# Patient Record
Sex: Female | Born: 1995 | Hispanic: No | Marital: Single | Smoking: Former smoker
Health system: Southern US, Community
[De-identification: ages and names within clinical notes are randomized; demographics above are authoritative.]

## PROBLEM LIST (undated history)

## (undated) DIAGNOSIS — J302 Other seasonal allergic rhinitis: Secondary | ICD-10-CM

## (undated) HISTORY — PX: NO PAST SURGERIES: SHX2092

---
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
## 2017-03-09 ENCOUNTER — Ambulatory Visit: Payer: Self-pay | Admitting: Advanced Practice Midwife

## 2017-03-09 ENCOUNTER — Encounter: Payer: Self-pay | Admitting: Advanced Practice Midwife

## 2017-03-15 ENCOUNTER — Encounter: Payer: Self-pay | Admitting: Advanced Practice Midwife

## 2017-03-15 ENCOUNTER — Ambulatory Visit (INDEPENDENT_AMBULATORY_CARE_PROVIDER_SITE_OTHER): Payer: Medicaid Other | Admitting: Advanced Practice Midwife

## 2017-03-15 VITALS — BP 130/70 | HR 72 | Ht 67.0 in | Wt 197.0 lb

## 2017-03-15 DIAGNOSIS — Z0000 Encounter for general adult medical examination without abnormal findings: Secondary | ICD-10-CM

## 2017-03-15 DIAGNOSIS — Z30017 Encounter for initial prescription of implantable subdermal contraceptive: Secondary | ICD-10-CM | POA: Diagnosis not present

## 2017-03-15 DIAGNOSIS — Z3046 Encounter for surveillance of implantable subdermal contraceptive: Secondary | ICD-10-CM | POA: Diagnosis not present

## 2017-03-15 NOTE — Progress Notes (Signed)
Patient ID: Georga KaufmannSamantha S Luepke, female   DOB: 1995-11-24, 22 y.o.   MRN: [id number]     Gynecology Annual Exam  [facility]  Chief Complaint:  Chief Complaint  Patient presents with  . Gynecologic Exam    talk about bc - use to be on the nexplanon;     History of Present Illness: Patient is a 22 y.o. G0P0000 presents for annual exam. The patient has no complaints today. She requests a new Nexplanon today. She previously had headaches on Nexplanon and had it removed prior to 3 years duration. She understands that she could experience headaches again as a side effect of Nexplanon and wants to proceed with getting the device.   LMP: Patient's last menstrual period was 03/09/2017 (exact date). Menarche:not applicable Average Interval: regular, 28 days Duration of flow: 7 days Heavy Menses: some days Clots: no Intermenstrual Bleeding: no Postcoital Bleeding: no Dysmenorrhea: no  The patient is sexually active. She currently uses condoms for contraception. She denies dyspareunia.  The patient does perform self breast exams.  There is no notable family history of breast or ovarian cancer in her family.  The patient wears seatbelts: yes.  The patient has regular exercise: she walks at her job.  She admits to trying to eat a healthy diet but needs to increase intake of h2o. She admits to adequate sleep and good support system.  The patient denies current symptoms of depression.    Review of Systems: Review of Systems  Constitutional: Negative.   HENT: Negative.   Eyes: Negative.   Respiratory: Negative.   Cardiovascular: Negative.   Gastrointestinal: Negative.   Genitourinary: Negative.   Musculoskeletal: Negative.   Skin: Negative.   Neurological: Negative.   Endo/Heme/Allergies: Negative.   Psychiatric/Behavioral: Negative.     Past Medical History:  History reviewed. No pertinent past medical history.  Past Surgical History:  Past Surgical History:    Procedure Laterality Date  . NO PAST SURGERIES      Gynecologic History:  Patient's last menstrual period was 03/09/2017 (exact date). Contraception: condoms Last Pap: She has never had a PAP smear due to age  Obstetric History: G0P0000  Family History:  Family History  Problem Relation Age of Onset  . Hypertension Father   . Diabetes Mellitus II Paternal Grandmother   . Breast cancer Maternal Aunt     Social History:  Social History   Socioeconomic History  . Marital status: Single    Spouse name: Not on file  . Number of children: Not on file  . Years of education: Not on file  . Highest education level: Not on file  Social Needs  . Financial resource strain: Not on file  . Food insecurity - worry: Not on file  . Food insecurity - inability: Not on file  . Transportation needs - medical: Not on file  . Transportation needs - non-medical: Not on file  Occupational History  . Not on file  Tobacco Use  . Smoking status: Former Games developermoker  . Smokeless tobacco: Never Used  Substance and Sexual Activity  . Alcohol use: No  . Drug use: No  . Sexual activity: Yes    Birth control/protection: Condom  Other Topics Concern  . Not on file  Social History Narrative  . Not on file    Allergies:  Allergies  Allergen Reactions  . Pollen Extract Other (See Comments)    Itchy, water eyes, stuffy nose, congestion    Medications: Prior to  Admission medications   Medication Sig Start Date End Date Taking? Authorizing Provider  ibuprofen (ADVIL,MOTRIN) 200 MG tablet Take 800 mg by mouth as needed for cramping.   Yes [provider]    Physical Exam Vitals: Blood pressure 130/70, pulse 72, height 5\' 7"  (1.702 m), weight 197 lb (89.4 kg), last menstrual period 03/09/2017.  General: NAD HEENT: normocephalic, anicteric Thyroid: no enlargement, no palpable nodules Pulmonary: No increased work of breathing, CTAB Cardiovascular: RRR, distal pulses 2+ Breast: Breast  symmetrical, no tenderness, no palpable nodules or masses, no skin or nipple retraction present, no nipple discharge.  No axillary or supraclavicular lymphadenopathy. Abdomen: NABS, soft, non-tender, non-distended.  Umbilicus without lesions.  No hepatomegaly, splenomegaly or masses palpable. No evidence of hernia  Genitourinary:  Deferred for no PAP smear and no concerns. Discussed reasons for doing pelvic exam with patient and patient declines exam Extremities: no edema, erythema, or tenderness Neurologic: Grossly intact Psychiatric: mood appropriate, affect full   Assessment: 22 y.o. G0P0000 routine annual exam  Plan: Problem List Items Addressed This Visit    None    Visit Diagnoses    Well woman exam without gynecological exam    -  Primary   Nexplanon insertion          1) 4) Gardasil Series discussed and if applicable offered to patient - Patient thinks she has previously completed 3 shot series   2) STI screening was offered and declined   3) ASCCP guidelines and rational discussed.  Patient opts for beginning at age 69 screening interval  4) Contraception - Nexplanon   5) Follow up 1 year for routine annual exam  Tresea Mall, CNM       GYNECOLOGY PROCEDURE NOTE  Patient is a 22 y.o. G0P0000 presenting for Nexplanon insertion as her desired means of contraception.  She provided informed consent, signed copy in the chart, time out was performed. Self reported LMP of Patient's last menstrual period was 03/09/2017 (exact date).  She understands that Nexplanon is a progesterone only therapy, and that often patients have irregular and unpredictable vaginal bleeding or amenorrhea. She understands that other side effects are possible related to systemic progesterone, including but not limited to, headaches, breast tenderness, nausea, and irritability. While effective at preventing pregnancy long acting reversible contraceptives do not prevent transmission of sexually  transmitted diseases and use of barrier methods for this purpose was discussed. The placement procedure for Nexplanon was reviewed with the patient in detail including risks of nerve injury, infection, bleeding and injury to other muscles or tendons. She understands that the Nexplanon implant is good for 3 years and needs to be removed at the end of that time.  She understands that Nexplanon is an extremely effective option for contraception, with failure rate of <1%. This information is reviewed today and all questions were answered. Informed consent was obtained, both verbally and written.   The patient is healthy and has no contraindications to Nexplanon use.   Procedure Appropriate time out taken.  Patient placed in dorsal supine with left arm above head, elbow flexed at 90 degrees, arm resting on examination table.  Previous insertion site was identified and the insertion site was prepped with a two betadine swabs and then injected with 2 ml of 1% lidocaine without epinephrine.  Nexplanon removed form sterile blister packaging,  Device confirmed in needle, before inserting full length of needle, tenting up the skin as the needle was advanced.  The drug eluding rod was then deployed  by pulling back the slider per the manufactures recommendation.  The implant was palpable by the clinician as well as the patient.  The insertion site covered dressed with a steri strip, and then a band aid before applying  a kerlex bandage pressure dressing..Minimal blood loss was noted during the procedure.  The patient tolerated the procedure well.   She was instructed to wear the bandage for 24 hours, call with any signs of infection.  She was given the Nexplanon card and instructed to have the rod removed in 3 years.  Tresea Mall, CNM   Charge [phone] for nexplanon device, CPT [phone] for procedure J2001 for lidocaine administration Modifer 25, plus Modifer 79 is done during a global billing visit

## 2017-09-02 ENCOUNTER — Ambulatory Visit
Admission: EM | Admit: 2017-09-02 | Discharge: 2017-09-02 | Disposition: A | Discharge status: Home / Self Care | Payer: BLUE CROSS/BLUE SHIELD | Attending: Family Medicine | Admitting: Family Medicine

## 2017-09-02 DIAGNOSIS — M25512 Pain in left shoulder: Secondary | ICD-10-CM | POA: Diagnosis not present

## 2017-09-02 MED ORDER — CYCLOBENZAPRINE HCL 10 MG PO TABS: 10.0000 mg | ORAL_TABLET | Freq: Every evening | ORAL | 0 refills | Status: DC | PRN

## 2017-09-02 MED ORDER — MELOXICAM 15 MG PO TABS: 15.0000 mg | ORAL_TABLET | Freq: Every day | ORAL | 0 refills | Status: DC | PRN

## 2017-09-02 NOTE — Discharge Instructions (Addendum)
Take medication as prescribed. Rest. Drink plenty of fluids. Stretch. Alternate heat and ice. Avoid aggravating activities.   Follow up with your primary care physician this week as needed. Return to Urgent care for new or worsening concerns.

## 2017-09-02 NOTE — ED Triage Notes (Signed)
Pt reports left shoulder pain that started several years ago but recently has worsened since starting a new job that requires repetitive movement with arms

## 2017-09-02 NOTE — ED Provider Notes (Signed)
[facility] ____________________________________________  Time seen: Approximately 10:00 AM  I have reviewed the triage vital signs and the nursing notes.   HISTORY  Chief Complaint Shoulder Pain   HPI Krista Logan is a 22 y.o. female presenting for evaluation of left posterior shoulder pain that is been present for approximate 1 year intermittently, and worse over the last 1 month.  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  Denies direct injury, direct trauma or heavy lifting, but reports only doing repetitive motions.  States pain over the last year has been intermittent, but reports of the last month more persistent.  States the pain is a aching pain with intermittent sharp and is sensitive to touch.  Denies any decreased range of motion, rash or swelling.  Reports right-hand dominant.  States has tried some over-the-counter ibuprofen Aleve and aspirin without much change.  Denies paresthesias, pain radiation, chest pain, shortness of breath, abdominal pain, weakness, fall or other complaints.  Reports otherwise feels well. Denies pregnancy.   Hill, [facility]: PCP  No past medical history on file.  There are no active problems to display for this patient.   Past Surgical History:  Procedure Laterality Date  . NO PAST SURGERIES       No current facility-administered medications for this encounter.   Current Outpatient Medications:  .  cyclobenzaprine (FLEXERIL) 10 MG tablet, Take 1 tablet (10 mg total) by mouth at bedtime as needed for muscle spasms. Do not drive while taking as can cause drowsiness, Disp: 15 tablet, Rfl: 0 .  ibuprofen (ADVIL,MOTRIN) 200 MG tablet, Take 800 mg by mouth as needed for cramping., Disp: , Rfl:  .  meloxicam (MOBIC) 15 MG tablet, Take 1 tablet (15 mg total) by mouth daily as needed., Disp: 10 tablet, Rfl: 0  Allergies Pollen  extract  Family History  Problem Relation Age of Onset  . Hypertension Father   . Diabetes Mellitus II Paternal Grandmother   . Breast cancer Maternal Aunt     Social History Social History   Tobacco Use  . Smoking status: Former Games developer  . Smokeless tobacco: Never Used  Substance Use Topics  . Alcohol use: No  . Drug use: No    Review of Systems Constitutional: No fever/chills Cardiovascular: Denies chest pain. Respiratory: Denies shortness of breath. Gastrointestinal: No abdominal pain.   Musculoskeletal: Negative for back pain. AS above.  Skin: Negative for rash.   ____________________________________________   PHYSICAL EXAM:  VITAL SIGNS: ED Triage Vitals  Enc Vitals Group     BP 09/02/17 0918 117/69     Pulse Rate 09/02/17 0918 74     Resp 09/02/17 0918 16     Temp 09/02/17 0918 98.5 F (36.9 C)     Temp Source 09/02/17 0918 Oral     SpO2 09/02/17 0918 98 %     Weight 09/02/17 0923 188 lb (85.3 kg)     Height 09/02/17 0919 5\' 7"  (1.702 m)     Head Circumference --      Peak Flow --      Pain Score 09/02/17 0947 2     Pain Loc --      Pain Edu? --      Excl. in GC? --     Constitutional: Alert and oriented. Well appearing and in no acute distress. ENT      Head: Normocephalic and atraumatic. Cardiovascular:  Normal rate, regular rhythm. Grossly normal heart sounds.  Good peripheral circulation. Respiratory: Normal respiratory effort without tachypnea nor retractions. Breath sounds are clear and equal bilaterally. No wheezes, rales, rhonchi. Gastrointestinal: Soft and nontender.  Musculoskeletal:  Nontender with normal range of motion in all extremities. No midline cervical, thoracic or lumbar tenderness to palpation. Bilateral distal radial pulses equal and easily palpated. Except: Left lateral parathoracic and parascapular tenderness to palpation with muscle tension noted, no bony tenderness, full range of motion present, no rash.  Neurologic:  Normal  speech and language. Speech is normal. No gait instability.  Skin:  Skin is warm, dry and intact. No rash noted. Psychiatric: Mood and affect are normal. Speech and behavior are normal. Patient exhibits appropriate insight and judgment   ___________________________________________   LABS (all labs ordered are listed, but only abnormal results are displayed)  Labs Reviewed - No data to display ____________________________________________  PROCEDURES Procedures    INITIAL IMPRESSION / ASSESSMENT AND PLAN / ED COURSE  Pertinent labs & imaging results that were available during my care of the patient were reviewed by me and considered in my medical decision making (see chart for details).  Well-appearing patient.  No acute distress.  No bony tenderness.  No direct trauma.  Muscular tenderness noted.  Suspect muscular tension and strain injuries with repetitive use.  Recommend stretching, supportive care, ice and heat, massage, and will treat with oral Mobic and nightly PRN Flexeril.  Avoidance of aggravating factors.  Work note given for today.Discussed indication, risks and benefits of medications with patient.  Discussed follow up with Primary care physician this week. Discussed follow up and return parameters including no resolution or any worsening concerns. Patient verbalized understanding and agreed to plan.   ____________________________________________   FINAL CLINICAL IMPRESSION(S) / ED DIAGNOSES  Final diagnoses:  Acute pain of left shoulder     ED Discharge Orders        Ordered              cyclobenzaprine (FLEXERIL) 10 MG tablet  At bedtime PRN     09/02/17 0942    meloxicam (MOBIC) 15 MG tablet  Daily PRN     09/02/17 [id number]       Note: This dictation was prepared with Dragon dictation along with smaller phrase technology. Any transcriptional errors that result from this process are unintentional.         Renford DillsMiller, Amel Gianino, NP 09/02/17 1038

## 2017-10-19 ENCOUNTER — Other Ambulatory Visit: Payer: Self-pay

## 2017-10-19 ENCOUNTER — Encounter: Payer: Self-pay | Admitting: Emergency Medicine

## 2017-10-19 ENCOUNTER — Ambulatory Visit
Admission: EM | Admit: 2017-10-19 | Discharge: 2017-10-19 | Disposition: A | Discharge status: Home / Self Care | Payer: BLUE CROSS/BLUE SHIELD | Attending: Emergency Medicine | Admitting: Emergency Medicine

## 2017-10-19 ENCOUNTER — Telehealth: Payer: Self-pay

## 2017-10-19 DIAGNOSIS — H00015 Hordeolum externum left lower eyelid: Secondary | ICD-10-CM | POA: Diagnosis not present

## 2017-10-19 MED ORDER — ERYTHROMYCIN 5 MG/GM OP OINT: 1.0000 "application " | TOPICAL_OINTMENT | Freq: Four times a day (QID) | OPHTHALMIC | 0 refills | Status: DC

## 2017-10-19 MED ORDER — ERYTHROMYCIN 5 MG/GM OP OINT: 1.0000 "application " | TOPICAL_OINTMENT | Freq: Four times a day (QID) | OPHTHALMIC | 0 refills | Status: AC

## 2017-10-19 NOTE — ED Provider Notes (Signed)
[facility] ____________________________________________  Time seen: Approximately 1:31 PM  I have reviewed the triage vital signs and the nursing notes.   HISTORY  Chief Complaint Eye Problem (left)  HPI Krista Logan is a 22 y.o. female presenting for evaluation of left eye complaints.  States yesterday she noticed some tenderness on her lower left eyelid in which she reports has continued into today.  States that the eye itself feels completely fine.  States the eye lid still is tender, red and was noted to be swollen today.  States that she also saw a yellow dot on her eyelid prompting her to come in.  Denies any foreign body sensation, concern of foreign body, abrupt onset, drainage.  Denies vision changes, photophobia, eye redness.  Wears glasses, does not wear contacts.  No alleviating measures attempted.  Denies other aggravating factors.  Reports otherwise feels well. Denies recent sickness. Denies recent antibiotic use.   No LMP recorded. Patient has had an implant.Denies pregnancy.  [facility]   History reviewed. No pertinent past medical history.  There are no active problems to display for this patient.   Past Surgical History:  Procedure Laterality Date  . NO PAST SURGERIES       No current facility-administered medications for this encounter.   Current Outpatient Medications:  .  ibuprofen (ADVIL,MOTRIN) 200 MG tablet, Take 800 mg by mouth as needed for cramping., Disp: , Rfl:  .  erythromycin ophthalmic ointment, Place 1 application into the left eye 4 (four) times daily for 7 days. For seven days, Disp: 3.5 g, Rfl: 0  Allergies Pollen extract  Family History  Problem Relation Age of Onset  . Hypertension Father   . Diabetes Mellitus II Paternal Grandmother   . Breast cancer Maternal Aunt     Social History Social History   Tobacco Use  . Smoking status: Former Games developermoker  . Smokeless tobacco: Never Used    Substance Use Topics  . Alcohol use: No  . Drug use: No    Review of Systems Constitutional: No fever/chills Eyes: No visual changes. As above.  ENT: No sore throat. Cardiovascular: Denies chest pain. Respiratory: Denies shortness of breath. Gastrointestinal: No abdominal pain.   Musculoskeletal: Negative for back pain. Skin: Negative for rash.   ____________________________________________   PHYSICAL EXAM:  VITAL SIGNS: ED Triage Vitals  Enc Vitals Group     BP 10/19/17 1241 133/83     Pulse Rate 10/19/17 1241 68     Resp 10/19/17 1241 16     Temp 10/19/17 1241 98.5 F (36.9 C)     Temp Source 10/19/17 1241 Oral     SpO2 10/19/17 1241 100 %     Weight 10/19/17 1240 170 lb (77.1 kg)     Height 10/19/17 1240 5\' 7"  (1.702 m)     Head Circumference --      Peak Flow --      Pain Score 10/19/17 1239 2     Pain Loc --      Pain Edu? --      Excl. in GC? --      Visual Acuity  Right Eye Distance: 20/25(corrected) Left Eye Distance: 20/70(corrected) Bilateral Distance: 20/25(corrected)  Constitutional: Alert and oriented. Well appearing and in no acute distress. Eyes: Conjunctivae are normal.  No drainage bilaterally.  No foreign bodies visualized bilaterally.  PERRL. EOMI. No pain with EOMs.  Left lower mid eyelid mild localized erythema and mild edema along eyelid  margin with slight pointing on margin, no drainage, no further surrounding erythema, mild tenderness to direct palpation, eye itself nontender. ENT      Head: Normocephalic and atraumatic.      Ears: Nontender, normal canal, no erythema, normal TMs bilaterally.      Nose: No congestion      Mouth/Throat: Mucous membranes are moist. Oropharynx non-erythematous.  No tonsillar swelling or exudate. Neck: No stridor. Supple without meningismus.  Hematological/Lymphatic/Immunilogical: No cervical lymphadenopathy. Cardiovascular: Normal rate, regular rhythm. Grossly normal heart sounds.  Good peripheral  circulation. Respiratory: Normal respiratory effort without tachypnea nor retractions. Breath sounds are clear and equal bilaterally. No wheezes, rales, rhonchi. Musculoskeletal:  Steady gait.  Neurologic:  Normal speech and language. Speech is normal. No gait instability.  Skin:  Skin is warm, dry.  Psychiatric: Mood and affect are normal. Speech and behavior are normal. Patient exhibits appropriate insight and judgment   ___________________________________________   LABS (all labs ordered are listed, but only abnormal results are displayed)  Labs Reviewed - No data to display  PROCEDURES Procedures   INITIAL IMPRESSION / ASSESSMENT AND PLAN / ED COURSE  Pertinent labs & imaging results that were available during my care of the patient were reviewed by me and considered in my medical decision making (see chart for details).  Well-appearing patient.  No acute distress.  Left lower eyelid with appearance consistent with a stye.  Encourage warm compresses, supportive care, keeping hands clean.  However patient continues to touch the area, will empirically start erythromycin ophthalmic ointment.  Discussed follow-up and return parameters. Discussed indication, risks and benefits of medications with patient.   Discussed follow up with Primary care physician or ophthalmology this week as needed. Discussed follow up and return parameters including no resolution or any worsening concerns. Patient verbalized understanding and agreed to plan.   ____________________________________________   FINAL CLINICAL IMPRESSION(S) / ED DIAGNOSES  Final diagnoses:  Hordeolum externum of left lower eyelid     ED Discharge Orders         Ordered    erythromycin ophthalmic ointment  4 times daily,   Status:  Discontinued     10/19/17 1332           Note: This dictation was prepared with Dragon dictation along with smaller phrase technology. Any transcriptional errors that result from this process  are unintentional.         Renford DillsMiller, Lenin Kuhnle, NP 10/19/17 1357

## 2017-10-19 NOTE — Discharge Instructions (Addendum)
Use medication as prescribed. Good hand hygiene. Frequent warm compresses. Monitor.   Follow up with your primary care physician or ophthalmology this week as needed. Return to Urgent care for new or worsening concerns.

## 2017-10-19 NOTE — ED Triage Notes (Signed)
Pt c/o left eye swelling and pain. She reports that inside her eye she can see a "yellow dot" and thinks she may have a stye.

## 2018-03-28 ENCOUNTER — Ambulatory Visit
Admission: EM | Admit: 2018-03-28 | Discharge: 2018-03-28 | Disposition: A | Discharge status: Home / Self Care | Payer: Commercial Managed Care - PPO | Attending: Family Medicine | Admitting: Family Medicine

## 2018-03-28 ENCOUNTER — Other Ambulatory Visit: Payer: Self-pay

## 2018-03-28 ENCOUNTER — Encounter: Payer: Self-pay | Admitting: Emergency Medicine

## 2018-03-28 DIAGNOSIS — J029 Acute pharyngitis, unspecified: Secondary | ICD-10-CM | POA: Insufficient documentation

## 2018-03-28 LAB — RAPID STREP SCREEN (MED CTR MEBANE ONLY): Streptococcus, Group A Screen (Direct): NEGATIVE

## 2018-03-28 LAB — MONONUCLEOSIS SCREEN: Mono Screen: NEGATIVE

## 2018-03-28 MED ORDER — AMOXICILLIN 500 MG PO CAPS: 500.0000 mg | ORAL_CAPSULE | Freq: Two times a day (BID) | ORAL | 0 refills | Status: DC

## 2018-03-28 NOTE — ED Triage Notes (Signed)
Patient in today c/o sore throat x 3 days. Patient has had a fever off & on 99-100.6. Patient has used OTC Ibuprofen, Nyquil and Dayquil.

## 2018-03-28 NOTE — ED Provider Notes (Signed)
[facility]    CSN: [id number] Arrival date & time: 03/28/18  2130     History   Chief Complaint Chief Complaint  Patient presents with  . Sore Throat    HPI Krista Logan is a 23 y.o. female.   HPI  Female accompanied by her mother of sore throat for 3 days.  She is had a fever off and on ranging from 99-100.6.  She has a runny nose but does not complain of coughing.  She has reported fatigue.  Temperature today is 98.9 pulse rate is 69 BP 109 78 respirations 16 O2 sats 100%       History reviewed. No pertinent past medical history.  There are no active problems to display for this patient.   Past Surgical History:  Procedure Laterality Date  . NO PAST SURGERIES      OB History    Gravida  0   Para  0   Term  0   Preterm  0   AB  0   Living  0     SAB  0   TAB  0   Ectopic  0   Multiple  0   Live Births  0            Home Medications    Prior to Admission medications   Medication Sig Start Date End Date Taking? Authorizing Provider  amoxicillin (AMOXIL) 500 MG capsule Take 1 capsule (500 mg total) by mouth 2 (two) times daily. 03/28/18   Lutricia Feil, PA-C  ibuprofen (ADVIL,MOTRIN) 200 MG tablet Take 800 mg by mouth as needed for cramping.    [provider]    Family History Family History  Problem Relation Age of Onset  . Hypertension Father   . Diabetes Mellitus II Paternal Grandmother   . Breast cancer Maternal Aunt   . Healthy Mother     Social History Social History   Tobacco Use  . Smoking status: Former Smoker    Last attempt to quit: 03/28/2017    Years since quitting: 1.0  . Smokeless tobacco: Never Used  Substance Use Topics  . Alcohol use: No  . Drug use: No     Allergies   Bee pollen and Pollen extract   Review of Systems Review of Systems  Constitutional: Positive for activity change, appetite change, chills, fatigue and fever.  HENT: Positive for congestion, rhinorrhea  and sore throat.   All other systems reviewed and are negative.    Physical Exam Triage Vital Signs ED Triage Vitals  Enc Vitals Group     BP 03/28/18 0914 109/78     Pulse Rate 03/28/18 0914 69     Resp 03/28/18 0914 16     Temp 03/28/18 0914 98.9 F (37.2 C)     Temp Source 03/28/18 0914 Oral     SpO2 03/28/18 0914 100 %     Weight 03/28/18 0915 160 lb (72.6 kg)     Height 03/28/18 0915 5\' 7"  (1.702 m)     Head Circumference --      Peak Flow --      Pain Score 03/28/18 0915 10     Pain Loc --      Pain Edu? --      Excl. in GC? --    No data found.  Updated Vital Signs BP 109/78 (BP Location: Left Arm)   Pulse 69   Temp 98.9 F (37.2 C) (Oral)   Resp  16   Ht 5\' 7"  (1.702 m)   Wt 160 lb (72.6 kg)   SpO2 100%   BMI 25.06 kg/m   Visual Acuity Right Eye Distance:   Left Eye Distance:   Bilateral Distance:    Right Eye Near:   Left Eye Near:    Bilateral Near:     Physical Exam Vitals signs and nursing note reviewed.  Constitutional:      General: She is not in acute distress.    Appearance: She is well-developed and normal weight. She is not ill-appearing, toxic-appearing or diaphoretic.  HENT:     Head: Normocephalic.     Right Ear: Tympanic membrane and ear canal normal.     Left Ear: Tympanic membrane and ear canal normal.     Mouth/Throat:     Mouth: Mucous membranes are moist. No oral lesions.     Pharynx: Oropharyngeal exudate present. No pharyngeal swelling, posterior oropharyngeal erythema or uvula swelling.     Tonsils: Tonsillar exudate present. No tonsillar abscesses. Swelling: 2+ on the right. 2+ on the left.  Eyes:     Conjunctiva/sclera: Conjunctivae normal.  Neck:     Musculoskeletal: Normal range of motion and neck supple.  Pulmonary:     Effort: Pulmonary effort is normal.     Breath sounds: Normal breath sounds.  Lymphadenopathy:     Cervical: No cervical adenopathy.  Skin:    General: Skin is warm and dry.  Neurological:      General: No focal deficit present.     Mental Status: She is alert and oriented to person, place, and time.  Psychiatric:        Mood and Affect: Mood normal.        Behavior: Behavior normal.      UC Treatments / Results  Labs (all labs ordered are listed, but only abnormal results are displayed) Labs Reviewed  RAPID STREP SCREEN ([facility] ONLY)  CULTURE, GROUP A STREP [facility]  MONONUCLEOSIS SCREEN    EKG None  Radiology No results found.  Procedures Procedures (including critical care time)  Medications Ordered in UC Medications - No data to display  Initial Impression / Assessment and Plan / UC Course  I have reviewed the triage vital signs and the nursing notes.  Pertinent labs & imaging results that were available during my care of the patient were reviewed by me and considered in my medical decision making (see chart for details).   Despite a negative rapid test, based on her physical exam with the exudative tonsillitis, I will place her empirically on amoxicillin awaiting the results of the cultures and sensitivities.  If the culture and sensitivities are negative then she will discontinue the use of the amoxicillin and will likely be a viral illness.  She will perform palliative  measures such as salt water gargles lozenges or cough drops.  Also recommended using ibuprofen for inflammation.   Final Clinical Impressions(s) / UC Diagnoses   Final diagnoses:  Sore throat   Discharge Instructions   None    ED Prescriptions    Medication Sig Dispense Auth. Provider   amoxicillin (AMOXIL) 500 MG capsule  (Status: Discontinued) Take 1 capsule (500 mg total) by mouth 2 (two) times daily. 20 capsule Ovid Curdoemer, Malyna Budney P, PA-C   amoxicillin (AMOXIL) 500 MG capsule Take 1 capsule (500 mg total) by mouth 2 (two) times daily. 20 capsule Lutricia Feiloemer, Gianluca Chhim P, PA-C     Controlled Substance Prescriptions [place] Controlled Substance Registry consulted?  Not Applicable     Lutricia Feil, PA-C 03/28/18 1113

## 2018-03-31 LAB — CULTURE, GROUP A STREP (THRC)

## 2019-01-06 ENCOUNTER — Ambulatory Visit
Admission: EM | Admit: 2019-01-06 | Discharge: 2019-01-06 | Disposition: A | Discharge status: Home / Self Care | Payer: Commercial Managed Care - PPO | Attending: Family Medicine | Admitting: Family Medicine

## 2019-01-06 ENCOUNTER — Other Ambulatory Visit: Payer: Self-pay

## 2019-01-06 DIAGNOSIS — R319 Hematuria, unspecified: Secondary | ICD-10-CM | POA: Diagnosis not present

## 2019-01-06 DIAGNOSIS — N390 Urinary tract infection, site not specified: Secondary | ICD-10-CM

## 2019-01-06 DIAGNOSIS — R350 Frequency of micturition: Secondary | ICD-10-CM | POA: Diagnosis not present

## 2019-01-06 LAB — URINALYSIS, COMPLETE (UACMP) WITH MICROSCOPIC
Bilirubin Urine: NEGATIVE
Glucose, UA: 100 mg/dL — AB
Ketones, ur: NEGATIVE mg/dL
Nitrite: POSITIVE — AB
Protein, ur: 30 mg/dL — AB
Specific Gravity, Urine: 1.02 (ref 1.005–1.030)
pH: 5.5 (ref 5.0–8.0)

## 2019-01-06 MED ORDER — FLUCONAZOLE 150 MG PO TABS: 150.0000 mg | ORAL_TABLET | Freq: Every day | ORAL | 0 refills | Status: DC

## 2019-01-06 MED ORDER — CEPHALEXIN 500 MG PO CAPS: 500.0000 mg | ORAL_CAPSULE | Freq: Two times a day (BID) | ORAL | 0 refills | Status: AC

## 2019-01-06 NOTE — ED Triage Notes (Signed)
Patient complains of urinary frequency, lower abdominal pain, painful urination x Wednesday. Patient did take AZO at 9am this morning.

## 2019-01-06 NOTE — Discharge Instructions (Addendum)
Take medication as prescribed. Rest. Drink plenty of fluids.  ° °Follow up with your primary care physician this week as needed. Return to Urgent care for new or worsening concerns.  ° °

## 2019-01-06 NOTE — ED Provider Notes (Signed)
[facility] ____________________________________________  Time seen: Approximately 3:55 PM  I have reviewed the triage vital signs and the nursing notes.   HISTORY  Chief Complaint Urinary Frequency  HPI Krista Logan is a 23 y.o. female presented for evaluation of urinary frequency and urgency with discomfort for the last 3 days.  States it burns with urination.  Has taken intermittent over-the-counter Azo which does help with symptoms but no resolution.  States similar to previous urinary infections. Denies atypical vaginal discharge, vaginal odor.  Denies concerns for STDs.  Continues eat and drink well.  Reports low suprapubic pressure intermittently but denies other pain.  Denies atypical back pain, fevers, cough, congestion, vomiting or diarrhea.  Denies other aggravating or alleviating factors.  Reports otherwise doing well.  No LMP recorded. Patient has had an implant. Denies pregnancy.     History reviewed. No pertinent past medical history.  There are no active problems to display for this patient.   Past Surgical History:  Procedure Laterality Date  . NO PAST SURGERIES       No current facility-administered medications for this encounter.   Current Outpatient Medications:  .  cephALEXin (KEFLEX) 500 MG capsule, Take 1 capsule (500 mg total) by mouth 2 (two) times daily for 7 days., Disp: 14 capsule, Rfl: 0 .  fluconazole (DIFLUCAN) 150 MG tablet, Take 1 tablet (150 mg total) by mouth daily. Take one pill orally, then Repeat in one week as needed., Disp: 2 tablet, Rfl: 0  Allergies Bee pollen and Pollen extract  Family History  Problem Relation Age of Onset  . Hypertension Father   . Diabetes Mellitus II Paternal Grandmother   . Breast cancer Maternal Aunt   . Healthy Mother     Social History Social History   Tobacco Use  . Smoking status: Former Smoker    Quit date: 03/28/2017    Years since quitting: 1.7  . Smokeless tobacco: Never  Used  Substance Use Topics  . Alcohol use: No  . Drug use: No    Review of Systems Constitutional: No fever Cardiovascular: Denies chest pain. Respiratory: Denies shortness of breath. Gastrointestinal: No abdominal pain.  No nausea, no vomiting.  No diarrhea.  Genitourinary: Positive for dysuria. Musculoskeletal: Negative for back pain. Skin: Negative for rash.  ____________________________________________   PHYSICAL EXAM:  VITAL SIGNS: ED Triage Vitals  Enc Vitals Group     BP 01/06/19 1547 (!) 124/99     Pulse Rate 01/06/19 1547 (!) 103     Resp 01/06/19 1547 17     Temp 01/06/19 1547 98.9 F (37.2 C)     Temp Source 01/06/19 1547 Oral     SpO2 01/06/19 1547 97 %     Weight 01/06/19 1546 180 lb (81.6 kg)     Height 01/06/19 1546 5\' 7"  (1.702 m)     Head Circumference --      Peak Flow --      Pain Score 01/06/19 1546 10     Pain Loc --      Pain Edu? --      Excl. in [place]? --     Constitutional: Alert and oriented. Well appearing and in no acute distress. Eyes: Conjunctivae are normal.  ENT      Head: Normocephalic and atraumatic. Cardiovascular: Normal rate, regular rhythm. Grossly normal heart sounds.  Good peripheral circulation. Respiratory: Normal respiratory effort without tachypnea nor retractions. Breath sounds are clear and equal bilaterally. No wheezes, rales, rhonchi. Gastrointestinal: Soft  and nontender.  No CVA tenderness. Musculoskeletal:  Steady gait.  Neurologic:  Normal speech and language. Speech is normal. No gait instability.  Skin:  Skin is warm, dry  Psychiatric: Mood and affect are normal. Speech and behavior are normal. Patient exhibits appropriate insight and judgment   ___________________________________________   LABS (all labs ordered are listed, but only abnormal results are displayed)  Labs Reviewed  URINALYSIS, COMPLETE (UACMP) WITH MICROSCOPIC - Abnormal; Notable for the following components:      Result Value   Color, Urine  ORANGE (*)    APPearance HAZY (*)    Glucose, UA 100 (*)    Hgb urine dipstick MODERATE (*)    Protein, ur 30 (*)    Nitrite POSITIVE (*)    Leukocytes,Ua SMALL (*)    Bacteria, UA FEW (*)    All other components within normal limits  URINE CULTURE    PROCEDURES Procedures    INITIAL IMPRESSION / ASSESSMENT AND PLAN / ED COURSE  Pertinent labs & imaging results that were available during my care of the patient were reviewed by me and considered in my medical decision making (see chart for details).  Well-appearing patient.  No acute distress.  Urinalysis reviewed, suspect UTI, will culture as well as yeast noted.  Will treat with oral Keflex and Diflucan.  Encourage rest, fluids, supportive care.Discussed indication, risks and benefits of medications with patient.   Discussed follow up with Primary care physician this week as needed. Discussed follow up and return parameters including no resolution or any worsening concerns. Patient verbalized understanding and agreed to plan.   ____________________________________________   FINAL CLINICAL IMPRESSION(S) / ED DIAGNOSES  Final diagnoses:  Urinary tract infection with hematuria, site unspecified     ED Discharge Orders         Ordered    cephALEXin (KEFLEX) 500 MG capsule  2 times daily     01/06/19 1606    fluconazole (DIFLUCAN) 150 MG tablet  Daily     01/06/19 1606           Note: This dictation was prepared with Dragon dictation along with smaller phrase technology. Any transcriptional errors that result from this process are unintentional.         Renford Dills, NP 01/06/19 1619

## 2019-01-09 ENCOUNTER — Telehealth (HOSPITAL_COMMUNITY): Payer: Self-pay | Admitting: Emergency Medicine

## 2019-01-09 LAB — URINE CULTURE: Culture: >=100000 — AB

## 2019-01-09 NOTE — Telephone Encounter (Signed)
Urine culture was positive for e coli and was given keflex  at urgent care visit. Attempted to reach patient. No answer at this time.   

## 2019-02-02 ENCOUNTER — Ambulatory Visit
Admission: EM | Admit: 2019-02-02 | Discharge: 2019-02-02 | Disposition: A | Discharge status: Home / Self Care | Payer: Commercial Managed Care - PPO | Attending: Family Medicine | Admitting: Family Medicine

## 2019-02-02 ENCOUNTER — Encounter: Payer: Self-pay | Admitting: Emergency Medicine

## 2019-02-02 ENCOUNTER — Other Ambulatory Visit: Payer: Self-pay

## 2019-02-02 DIAGNOSIS — R5383 Other fatigue: Secondary | ICD-10-CM | POA: Diagnosis not present

## 2019-02-02 DIAGNOSIS — J029 Acute pharyngitis, unspecified: Secondary | ICD-10-CM | POA: Diagnosis not present

## 2019-02-02 DIAGNOSIS — R509 Fever, unspecified: Secondary | ICD-10-CM | POA: Diagnosis not present

## 2019-02-02 DIAGNOSIS — Z20822 Contact with and (suspected) exposure to covid-19: Secondary | ICD-10-CM

## 2019-02-02 DIAGNOSIS — J069 Acute upper respiratory infection, unspecified: Secondary | ICD-10-CM | POA: Diagnosis not present

## 2019-02-02 DIAGNOSIS — Z20828 Contact with and (suspected) exposure to other viral communicable diseases: Secondary | ICD-10-CM

## 2019-02-02 NOTE — Discharge Instructions (Signed)
It was very nice seeing you today in clinic. Thank you for entrusting me with your care.   Rest and Stay HYDRATED. Water and electrolyte containing beverages (Gatorade, Pedialyte) are best to prevent dehydration and electrolyte abnormalities. Warm salt water gargles and lozenges will helo soothe throat. May use Tylenol and/or Ibuprofen as needed for pain/fever.   You were tested for SARS-CoV-2 (novel coronavirus) today. Testing is performed by an outside lab ([facility]) and has variable turn around times ranging between 2-5 days. Current recommendations from the the CDC and [place] DHHS require that you remain out of work in order to quarantine at home until negative test results are have been received. In the event that your test results are positive, you will be contacted with further directives. These measures are being implemented out of an abundance of caution to prevent transmission and spread during the current SARS-CoV-2 pandemic.  Make arrangements to follow up with your regular doctor in 1 week for re-evaluation if not improving. If your symptoms/condition worsens, please seek follow up care either here or in the ER. Please remember, our [facility] providers are "right here with you" when you need [place].   Again, it was my pleasure to take care of you today. Thank you for choosing our clinic. I hope that you start to feel better quickly.   Honor Loh, MSN, APRN, FNP-C, CEN Advanced Practice Provider [facility]

## 2019-02-02 NOTE — ED Triage Notes (Addendum)
Patient in today after having +covid exposure on Tuesday (01/30/19). Patient c/o low grade fever (99), scratchy throat and nasal drainage x 2 days.

## 2019-02-02 NOTE — ED Provider Notes (Signed)
[place], [place]   Name: Krista Logan DOB: 07/27/1995 MRN: [id number] CSN: [id number] PCP: [facility]  Arrival date and time:  02/02/19 8250  Chief Complaint:  Fatigue, Sore Throat, and Fever   NOTE: Prior to seeing the patient today, I have reviewed the triage nursing documentation and vital signs. Clinical staff has updated patient's PMH/PSHx, current medication list, and drug allergies/intolerances to ensure comprehensive history available to assist in medical decision making.   History:   HPI: Krista Logan is a 23 y.o. female who presents today with complaints of sinus drainage, fatigue, and scratchy throat that started approximately 2 days ago. Patient has been running an elevated temperature, and reports that her Tmax has been running in the 99 range. She denies cough and shortness of breath. Patient with some mild paranasal sinus tenderness. She denies that she has experienced any nausea, vomiting, diarrhea, or abdominal pain. She is eating and drinking well. Patient denies any perceived alterations to her sense of taste or smell. Patient concerned about SARS-CoV-2 (novel coronavirus) citing the fact that her grandmother was tested earlier in the week and received positive results yesterday. Patient last in contact with grandmother on 01/30/2019. She has never been tested for SARS-CoV-2 (novel coronavirus) in the past per his report. Patient has not been vaccinated for influenza as of yet this season. Despite her symptoms, patient has not taken any over the counter interventions to help improve/relieve her reported symptoms at home.    History reviewed. No pertinent past medical history.  Past Surgical History:  Procedure Laterality Date  . NO PAST SURGERIES      Family History  Problem Relation Age of Onset  . Hypertension Father   . Diabetes Mellitus II Paternal Grandmother   . Breast cancer Maternal Aunt   . Healthy Mother     Social History    Tobacco Use  . Smoking status: Former Smoker    Quit date: 03/28/2017    Years since quitting: 1.8  . Smokeless tobacco: Never Used  Substance Use Topics  . Alcohol use: No  . Drug use: No    There are no active problems to display for this patient.   Home Medications:    No outpatient medications have been marked as taking for the 02/02/19 encounter [facility] Encounter).    Allergies:   Bee pollen and Pollen extract  Review of Systems (ROS): Review of Systems  Constitutional: Positive for fatigue and fever.  HENT: Positive for postnasal drip, sinus pressure and sore throat. Negative for congestion, ear pain, rhinorrhea, sinus pain and sneezing.   Eyes: Negative for pain, discharge and redness.  Respiratory: Negative for cough, chest tightness and shortness of breath.   Cardiovascular: Negative for chest pain and palpitations.  Gastrointestinal: Negative for abdominal pain, diarrhea, nausea and vomiting.  Musculoskeletal: Negative for arthralgias, back pain, myalgias and neck pain.  Skin: Negative for color change, pallor and rash.  Neurological: Negative for dizziness, syncope, weakness and headaches.  Hematological: Negative for adenopathy.     Vital Signs: Today's Vitals   02/02/19 0851 02/02/19 0852 02/02/19 0915  BP: (!) 126/96    Pulse: 94    Resp: 18    Temp: 98.8 F (37.1 C)    TempSrc: Oral    SpO2: 99%    Weight:  180 lb (81.6 kg)   Height:  5\' 6"  (1.676 m)   PainSc: 0-No pain  0-No pain    Physical Exam: Physical Exam  Constitutional:  She is oriented to person, place, and time and well-developed, well-nourished, and in no distress. No distress.  HENT:  Head: Normocephalic and atraumatic.  Nose: Rhinorrhea and sinus tenderness (mild) present.  Mouth/Throat: Uvula is midline and mucous membranes are normal. Posterior oropharyngeal erythema (+) clear PND present. No oropharyngeal exudate or posterior oropharyngeal edema.  Eyes: Pupils are equal, round,  and reactive to light.  Neck: Normal range of motion. Neck supple.  Cardiovascular: Normal rate, regular rhythm, normal heart sounds and intact distal pulses.  Pulmonary/Chest: Effort normal and breath sounds normal.  Musculoskeletal: Normal range of motion.  Neurological: She is alert and oriented to person, place, and time. Gait normal.  Skin: Skin is warm and dry. No rash noted. She is not diaphoretic.  Psychiatric: Mood, memory, affect and judgment normal.  Nursing note and vitals reviewed.   Urgent Care Treatments / Results:  LABS: PLEASE NOTE: all labs that were ordered this encounter are listed, however only abnormal results are displayed. Labs Reviewed  NOVEL CORONAVIRUS, NAA (HOSP ORDER, SEND-OUT TO REF LAB; TAT 18-24 HRS)    EKG: -None  RADIOLOGY: No results found.  PROCEDURES: Procedures  MEDICATIONS RECEIVED THIS VISIT: Medications - No data to display  PERTINENT CLINICAL COURSE NOTES/UPDATES:   Initial Impression / Assessment and Plan / Urgent Care Course:  Pertinent labs & imaging results that were available during my care of the patient were personally reviewed by me and considered in my medical decision making (see lab/imaging section of note for values and interpretations).  Krista Logan is a 23 y.o. female who presents to [facility] today with complaints of Fatigue, Sore Throat, and Fever  Patient overall well appearing and in no acute distress today in clinic. Presenting symptoms (see HPI) and exam as documented above. She presents with symptoms associated with SARS-CoV-2 (novel coronavirus). She has had a recent exposure to a family member who has tested positive for the virus. Discussed typical symptom constellation. Reviewed potential for infection and need for testing. Patient amenable to being tested. SARS-CoV-2 swab collected by certified clinical staff. Discussed variable turn around times associated with testing, as swabs are being  processed at [facility], and have been taking between 2-5 days to come back. She was advised to self quarantine, per [facility] DHHS guidelines, until negative results received.   Rapid streptococcal throat swab (-); reflex culture sent. Symptoms consistent with acute viral illness. Until ruled out with confirmatory lab testing, SARS-CoV-2 remains part of the differential. Her testing is pending at this time. I discussed with her that her symptoms are felt to be viral in nature, thus antibiotics would not offer her any relief or improve his symptoms any faster than conservative symptomatic management. Discussed supportive care measures at home during acute phase of illness. Patient to rest as much as possible. She was encouraged to ensure adequate hydration (water and ORS) to prevent dehydration and electrolyte derangements. Patient may use APAP and/or IBU on an as needed basis for pain/fever. Encouraged warm salt water gargles and throat lozenges to help sooth throat irritation.   Discussed follow up with primary care physician in 1 week for re-evaluation. I have reviewed the follow up and strict return precautions for any new or worsening symptoms. Patient is aware of symptoms that would be deemed urgent/emergent, and would thus require further evaluation either here or in the emergency department. At the time of discharge, she verbalized understanding and consent with the discharge plan as it was reviewed with her. All  questions were fielded by provider and/or clinic staff prior to patient discharge.    Final Clinical Impressions / Urgent Care Diagnoses:   Final diagnoses:  Viral URI  Close exposure to COVID-19 virus  Encounter for laboratory testing for COVID-19 virus    New Prescriptions:   Controlled Substance Registry consulted? Not Applicable  No orders of the defined types were placed in this encounter.   Recommended Follow up Care:  Patient encouraged to follow up with the following provider  within the specified time frame, or sooner as dictated by the severity of her symptoms. As always, she was instructed that for any urgent/emergent care needs, she should seek care either here or in the emergency department for more immediate evaluation.  Follow-up Information    [place] In 1 week.   Why: General reassessment of symptoms if not improving Contact information: [facility] [address] [place] [address] [phone]         NOTE: This note was prepared using Dragon dictation software along with smaller phrase technology. Despite my best ability to proofread, there is the potential that transcriptional errors may still occur from this process, and are completely unintentional.    Verlee MonteGray, Riane Rung E, NP 02/03/19 [phone]

## 2019-02-03 LAB — NOVEL CORONAVIRUS, NAA (HOSP ORDER, SEND-OUT TO REF LAB; TAT 18-24 HRS): SARS-CoV-2, NAA: NOT DETECTED

## 2019-03-23 ENCOUNTER — Other Ambulatory Visit: Payer: Self-pay

## 2019-03-23 ENCOUNTER — Ambulatory Visit
Admission: EM | Admit: 2019-03-23 | Discharge: 2019-03-23 | Disposition: A | Discharge status: Home / Self Care | Payer: Commercial Managed Care - PPO | Attending: Family Medicine | Admitting: Family Medicine

## 2019-03-23 ENCOUNTER — Encounter: Payer: Self-pay | Admitting: Emergency Medicine

## 2019-03-23 ENCOUNTER — Ambulatory Visit (INDEPENDENT_AMBULATORY_CARE_PROVIDER_SITE_OTHER): Payer: Commercial Managed Care - PPO

## 2019-03-23 DIAGNOSIS — M25561 Pain in right knee: Secondary | ICD-10-CM

## 2019-03-23 MED ORDER — DICLOFENAC SODIUM 75 MG PO TBEC: 75.0000 mg | DELAYED_RELEASE_TABLET | Freq: Two times a day (BID) | ORAL | 0 refills | Status: DC | PRN

## 2019-03-23 NOTE — Discharge Instructions (Addendum)
Recommend start Voltaren 1 tablet twice a day as needed for pain and inflammation. May apply warm compresses to area for comfort. Recommend contact [facility] in 3 to 5 days if knee pain is not improving.

## 2019-03-23 NOTE — ED Provider Notes (Signed)
[facility]    CSN: [id number] Arrival date & time: 03/23/19  1048      History   Chief Complaint Chief Complaint  Patient presents with  . Knee Pain    right    HPI Krista Logan is a 24 y.o. female.   24 year old female presents with right knee pain that started about 2 weeks ago. No distinct injury or Fall but started with intermittent pain mainly on lateral aspect of the knee. Has continued to get worse with movements and feels like her knee cap is "loose". Has noticed cold air makes it worse. Pain does travel some up her right thigh but denies any pain in her lower leg or any numbness. Able to bear weight but with pain. Has been wearing a soft knee brace and has taken Ibuprofen with no relief. No previous injury to her knee. No other chronic health issues. Only current med is Nexplanon implant.   The history is provided by the patient.  Knee Pain Location:  Knee Injury: no   Knee location:  R knee Pain details:    Quality:  Shooting, throbbing and dull   Severity:  Moderate   Onset quality:  Gradual   Duration:  2 weeks   Timing:  Intermittent   Progression:  Worsening Chronicity:  New Dislocation: no   Foreign body present:  No foreign bodies Prior injury to area:  No Relieved by:  Nothing Worsened by:  Extension and activity Ineffective treatments:  NSAIDs and compression Associated symptoms: decreased ROM and stiffness   Associated symptoms: no back pain, no fatigue, no fever, no itching, no muscle weakness, no numbness, no swelling and no tingling   Risk factors: no concern for non-accidental trauma, no frequent fractures, no known bone disorder and no recent illness     History reviewed. No pertinent past medical history.  There are no problems to display for this patient.   Past Surgical History:  Procedure Laterality Date  . NO PAST SURGERIES      OB History    Gravida  0   Para  0   Term  0   Preterm  0   AB  0   Living    0     SAB  0   TAB  0   Ectopic  0   Multiple  0   Live Births  0            Home Medications    Prior to Admission medications   Medication Sig Start Date End Date Taking? Authorizing Provider  etonogestrel (NEXPLANON) 68 MG IMPL implant 1 each by Subdermal route once.   Yes [provider]  diclofenac (VOLTAREN) 75 MG EC tablet Take 1 tablet (75 mg total) by mouth 2 (two) times daily as needed for moderate pain. 03/23/19   Katy Apo, NP    Family History Family History  Problem Relation Age of Onset  . Hypertension Father   . Diabetes Mellitus II Paternal Grandmother   . Breast cancer Maternal Aunt   . Healthy Mother     Social History Social History   Tobacco Use  . Smoking status: Former Smoker    Quit date: 03/28/2017    Years since quitting: 1.9  . Smokeless tobacco: Never Used  Substance Use Topics  . Alcohol use: No  . Drug use: No     Allergies   Patient has no active allergies.   Review of Systems  Review of Systems  Constitutional: Negative for activity change, appetite change, chills, fatigue and fever.  Respiratory: Negative for chest tightness and shortness of breath.   Cardiovascular: Negative for chest pain and leg swelling.  Gastrointestinal: Negative for nausea and vomiting.  Musculoskeletal: Positive for arthralgias, gait problem and stiffness. Negative for back pain and joint swelling.  Skin: Negative for color change, itching, rash and wound.  Allergic/Immunologic: Negative for environmental allergies, food allergies and immunocompromised state.  Neurological: Negative for dizziness, tremors, seizures, syncope, weakness, light-headedness and numbness.  Hematological: Negative for adenopathy. Does not bruise/bleed easily.     Physical Exam Triage Vital Signs ED Triage Vitals  Enc Vitals Group     BP 03/23/19 1134 116/68     Pulse Rate 03/23/19 1134 70     Resp 03/23/19 1134 14     Temp 03/23/19 1134 98.7 F  (37.1 C)     Temp Source 03/23/19 1134 Oral     SpO2 03/23/19 1134 100 %     Weight 03/23/19 1131 190 lb (86.2 kg)     Height 03/23/19 1131 5\' 6"  (1.676 m)     Head Circumference --      Peak Flow --      Pain Score --      Pain Loc --      Pain Edu? --      Excl. in GC? --    No data found.  Updated Vital Signs BP 116/68 (BP Location: Right Arm)   Pulse 70   Temp 98.7 F (37.1 C) (Oral)   Resp 14   Ht 5\' 6"  (1.676 m)   Wt 190 lb (86.2 kg)   SpO2 100%   BMI 30.67 kg/m   Visual Acuity Right Eye Distance:   Left Eye Distance:   Bilateral Distance:    Right Eye Near:   Left Eye Near:    Bilateral Near:     Physical Exam Vitals and nursing note reviewed.  Constitutional:      General: She is awake. She is not in acute distress.    Appearance: Normal appearance. She is well-developed, well-groomed and overweight.     Comments: Patient sitting comfortably in exam chair in no acute distress.   HENT:     Head: Normocephalic and atraumatic.  Eyes:     Extraocular Movements: Extraocular movements intact.     Conjunctiva/sclera: Conjunctivae normal.  Cardiovascular:     Rate and Rhythm: Normal rate.  Pulmonary:     Effort: Pulmonary effort is normal.  Musculoskeletal:        General: Tenderness present. No swelling, deformity or signs of injury.     Right knee: No swelling, erythema or ecchymosis. Decreased range of motion. Tenderness present over the lateral joint line. Normal alignment and normal patellar mobility. Normal pulse.     Left knee: Normal.     Right lower leg: No edema.     Left lower leg: No edema.       Legs:     Comments: Decreased range of motion of right knee- especially with full flexion and extension. Tender along lateral aspect of patella and just below the patella. No distinct swelling, redness or rash. No pain or tenderness in right lower leg. Good distal pulses. No neuro deficits noted.   Skin:    General: Skin is warm and dry.     Capillary  Refill: Capillary refill takes less than 2 seconds.     Findings: No rash.  Neurological:  General: No focal deficit present.     Mental Status: She is alert and oriented to person, place, and time.     Sensory: Sensation is intact. No sensory deficit.     Motor: Motor function is intact.     Gait: Gait is intact.     Deep Tendon Reflexes: Reflexes are normal and symmetric.     Comments: Able to bear weight on right leg and knee but with some limping.   Psychiatric:        Mood and Affect: Mood normal.        Behavior: Behavior normal. Behavior is cooperative.        Thought Content: Thought content normal.        Judgment: Judgment normal.      UC Treatments / Results  Labs (all labs ordered are listed, but only abnormal results are displayed) Labs Reviewed - No data to display  EKG   Radiology DG Knee Complete 4 Views Right  Result Date: 03/23/2019 CLINICAL DATA:  Right knee pain. EXAM: RIGHT KNEE - COMPLETE 4+ VIEW COMPARISON:  No prior. FINDINGS: No acute soft tissue bony abnormality. No evidence of fracture or dislocation. No evidence of effusion. IMPRESSION: No acute bony or joint abnormality. Electronically Signed   By: Maisie Fus  Register   On: 03/23/2019 12:41    Procedures Procedures (including critical care time)  Medications Ordered in UC Medications - No data to display  Initial Impression / Assessment and Plan / UC Course  I have reviewed the triage vital signs and the nursing notes.  Pertinent labs & imaging results that were available during my care of the patient were reviewed by me and considered in my medical decision making (see chart for details).    Reviewed negative x-ray results with patient. Discussed that she may have stretched a ligament or irritated a tendon. Recommend trial Voltaren 75mg  twice a day as needed for pain and inflammation. May apply warm compresses to area for comfort. Do not need to wear knee brace unless it provides comfort and  support. Recommend contact [facility] in 3 to 5 days if pain is not improving.  Final Clinical Impressions(s) / UC Diagnoses   Final diagnoses:  Acute pain of right knee     Discharge Instructions     Recommend start Voltaren 1 tablet twice a day as needed for pain and inflammation. May apply warm compresses to area for comfort. Recommend contact [facility] in 3 to 5 days if knee pain is not improving.     ED Prescriptions    Medication Sig Dispense Auth. Provider   diclofenac (VOLTAREN) 75 MG EC tablet Take 1 tablet (75 mg total) by mouth 2 (two) times daily as needed for moderate pain. 20 tablet Amandalynn Pitz, , NP     PDMP not reviewed this encounter.   Ali Lowe, NP 03/24/19 [phone]

## 2019-03-23 NOTE — ED Triage Notes (Signed)
Patient c/o pain in her right knee that started 2 weeks ago.  Patient denies injury or fall.  Patient able to walk and bear weight on her right leg.

## 2019-04-11 ENCOUNTER — Other Ambulatory Visit: Payer: Self-pay

## 2019-04-11 ENCOUNTER — Encounter: Payer: Self-pay | Admitting: Emergency Medicine

## 2019-04-11 ENCOUNTER — Ambulatory Visit
Admission: EM | Admit: 2019-04-11 | Discharge: 2019-04-11 | Disposition: A | Discharge status: Home / Self Care | Payer: Commercial Managed Care - PPO | Attending: Urgent Care | Admitting: Urgent Care

## 2019-04-11 DIAGNOSIS — R0602 Shortness of breath: Secondary | ICD-10-CM

## 2019-04-11 DIAGNOSIS — R0981 Nasal congestion: Secondary | ICD-10-CM | POA: Diagnosis not present

## 2019-04-11 DIAGNOSIS — U071 COVID-19: Secondary | ICD-10-CM | POA: Diagnosis not present

## 2019-04-11 DIAGNOSIS — B349 Viral infection, unspecified: Secondary | ICD-10-CM

## 2019-04-11 DIAGNOSIS — R5383 Other fatigue: Secondary | ICD-10-CM

## 2019-04-11 DIAGNOSIS — Z20822 Contact with and (suspected) exposure to covid-19: Secondary | ICD-10-CM | POA: Diagnosis present

## 2019-04-11 DIAGNOSIS — J029 Acute pharyngitis, unspecified: Secondary | ICD-10-CM

## 2019-04-11 DIAGNOSIS — Z7189 Other specified counseling: Secondary | ICD-10-CM | POA: Diagnosis present

## 2019-04-11 LAB — GROUP A STREP BY PCR: Group A Strep by PCR: NOT DETECTED

## 2019-04-11 NOTE — ED Triage Notes (Signed)
Pt c/o sore throat, shortness of breath, headache, runny nose, body aches and nasal congestion. Started about 4 days ago. Denies fever.

## 2019-04-11 NOTE — ED Provider Notes (Signed)
[place], [place]   Name: Krista Logan DOB: 11/25/1995 MRN: [id number] CSN: [id number] PCP: [facility]  Arrival date and time:  04/11/19 5638  Chief Complaint:  Sore Throat and Shortness of Breath   NOTE: Prior to seeing the patient today, I have reviewed the triage nursing documentation and vital signs. Clinical staff has updated patient's PMH/PSHx, current medication list, and drug allergies/intolerances to ensure comprehensive history available to assist in medical decision making.   History:   HPI: Krista Logan is a 24 y.o. female who presents today with complaints of fatigue, sore throat, congestion, shortness or breath, and diffuse myalgias that started on approximately 04/07/2019. Patient denies fevers. She denies any cough or wheezing. Patient has not experienced any nausea, vomiting, diarrhea, or abdominal pain. She is eating and drinking well. Patient describes her sense of taste and smell as being "off". Patient presents out of concerns for her personal health after being exposed to several coworkers who tested positive for SARS-CoV-2 (novel coronavirus). She has not been tested for SARS-CoV-2 (novel coronavirus) in the past 14 days; last tested negative on 02/02/2019 per her report. No one else is her home has experienced a similar symptom constellation. Patient has not been vaccinated for influenza this season. Despite her symptoms, patient has not taken any over the counter interventions to help improve/relieve her reported symptoms at home.   History reviewed. No pertinent past medical history.  Past Surgical History:  Procedure Laterality Date  . NO PAST SURGERIES      Family History  Problem Relation Age of Onset  . Hypertension Father   . Diabetes Mellitus II Paternal Grandmother   . Breast cancer Maternal Aunt   . Healthy Mother     Social History   Tobacco Use  . Smoking status: Former Smoker    Quit date: 03/28/2017    Years since  quitting: 2.0  . Smokeless tobacco: Never Used  Substance Use Topics  . Alcohol use: No  . Drug use: No    There are no problems to display for this patient.   Home Medications:    Current Meds  Medication Sig  . etonogestrel (NEXPLANON) 68 MG IMPL implant 1 each by Subdermal route once.    Allergies:   Patient has no known allergies.  Review of Systems (ROS): Review of Systems  Constitutional: Positive for fatigue. Negative for fever.  HENT: Positive for congestion and sore throat. Negative for ear pain, postnasal drip, rhinorrhea, sinus pressure, sinus pain and sneezing.        (+) altered sense of taste and smell.  Eyes: Negative for pain, discharge and redness.  Respiratory: Positive for shortness of breath. Negative for cough and chest tightness.   Cardiovascular: Negative for chest pain and palpitations.  Gastrointestinal: Negative for abdominal pain, diarrhea, nausea and vomiting.  Musculoskeletal: Positive for myalgias. Negative for arthralgias, back pain and neck pain.  Skin: Negative for color change, pallor and rash.  Neurological: Negative for dizziness, syncope, weakness and headaches.  Hematological: Negative for adenopathy.     Vital Signs: Today's Vitals   04/11/19 0908 04/11/19 0910 04/11/19 0912  BP:   130/86  Pulse:   92  Resp:   18  Temp:   98.6 F (37 C)  TempSrc:   Oral  SpO2:   100%  Weight:  180 lb (81.6 kg)   Height:  5\' 7"  (1.702 m)   PainSc: 7       Physical Exam: Physical  Exam  Constitutional: She is oriented to person, place, and time and well-developed, well-nourished, and in no distress.  HENT:  Head: Normocephalic and atraumatic.  Right Ear: Tympanic membrane normal.  Left Ear: Tympanic membrane normal.  Nose: Mucosal edema and rhinorrhea present. No sinus tenderness.  Mouth/Throat: Uvula is midline and mucous membranes are normal. Posterior oropharyngeal erythema present. No oropharyngeal exudate or posterior oropharyngeal  edema.  Eyes: Pupils are equal, round, and reactive to light.  Cardiovascular: Normal rate, regular rhythm, normal heart sounds and intact distal pulses.  Pulmonary/Chest: Effort normal and breath sounds normal.  Neurological: She is alert and oriented to person, place, and time. Gait normal.  Skin: Skin is warm and dry. No rash noted. She is not diaphoretic.  Psychiatric: Mood, memory, affect and judgment normal.  Nursing note and vitals reviewed.   Urgent Care Treatments / Results:   Orders Placed This Encounter  Procedures  . Novel Coronavirus, NAA (Hosp order, Send-out to Ref Lab; TAT 18-24 hrs  . Group A Strep by PCR    LABS: PLEASE NOTE: all labs that were ordered this encounter are listed, however only abnormal results are displayed. Labs Reviewed  GROUP A STREP BY PCR  NOVEL CORONAVIRUS, NAA (HOSP ORDER, SEND-OUT TO REF LAB; TAT 18-24 HRS)    EKG: -None  RADIOLOGY: No results found.  PROCEDURES: Procedures  MEDICATIONS RECEIVED THIS VISIT: Medications - No data to display  PERTINENT CLINICAL COURSE NOTES/UPDATES:   Initial Impression / Assessment and Plan / Urgent Care Course:  Pertinent labs & imaging results that were available during my care of the patient were personally reviewed by me and considered in my medical decision making (see lab/imaging section of note for values and interpretations).  Krista Logan is a 24 y.o. female who presents to [facility] today with complaints of Sore Throat and Shortness of Breath  Patient overall well appearing and in no acute distress today in clinic. Presenting symptoms (see HPI) and exam as documented above. She presents with symptoms associated with SARS-CoV-2 (novel coronavirus) following several occupational exposures. Discussed typical symptom constellation. Reviewed potential for infection and need for testing. Patient amenable to being tested. SARS-CoV-2 swab collected by certified clinical staff.  Discussed variable turn around times associated with testing, as swabs are being processed at [facility], and have been taking between 24-48 hours to come back. She was advised to self quarantine, per [facility] DHHS guidelines, until negative results received. These measures are being implemented out of an abundance of caution to prevent transmission and spread during the current SARS-CoV-2 pandemic.  PCR streptococcal throat swab (-). Presenting symptoms consistent with acute viral illness. Until ruled out with confirmatory lab testing, SARS-CoV-2 remains part of the differential. Her testing is pending at this time. I discussed with her that her symptoms are felt to be viral in nature, thus antibiotics would not offer her any relief or improve her symptoms any faster than conservative symptomatic management. Discussed supportive care measures at home during acute phase of illness. Patient to rest as much as possible. She was encouraged to ensure adequate hydration (water and ORS) to prevent dehydration and electrolyte derangements. Recommended warm salt water gargles, hard candies/lozenges, and hot tea with honey/lemon to help soothe the throat and reduce irritation. May use Tylenol and/or Ibuprofen as needed for pain/fever.   Current clinical condition warrants patient being out of work in order to quarantine while waiting for testing results. She was provided with the appropriate documentation to provide to  her place of employment that will allow for her to RTW on 04/13/2019 with no restrictions. RTW is contingent on her SARS-CoV-2 test results being reviewed as negative.     Discussed follow up with primary care physician in 1 week for re-evaluation. I have reviewed the follow up and strict return precautions for any new or worsening symptoms. Patient is aware of symptoms that would be deemed urgent/emergent, and would thus require further evaluation either here or in the emergency department. At the time of  discharge, she verbalized understanding and consent with the discharge plan as it was reviewed with her. All questions were fielded by provider and/or clinic staff prior to patient discharge.    Final Clinical Impressions / Urgent Care Diagnoses:   Final diagnoses:  Viral illness  Exposure to COVID-19 virus  Encounter for laboratory testing for COVID-19 virus  Advice given about COVID-19 virus infection    New Prescriptions:  [place] Controlled Substance Registry consulted? Not Applicable  No orders of the defined types were placed in this encounter.   Recommended Follow up Care:  Patient encouraged to follow up with the following provider within the specified time frame, or sooner as dictated by the severity of her symptoms. As always, she was instructed that for any urgent/emergent care needs, she should seek care either here or in the emergency department for more immediate evaluation.  Follow-up Information    [facility] In 1 week.   Why: General reassessment of symptoms if not improving Contact information: [facility] [address] [place] [address] [phone]         NOTE: This note was prepared using Dragon dictation software along with smaller phrase technology. Despite my best ability to proofread, there is the potential that transcriptional errors may still occur from this process, and are completely unintentional.     Verlee Monte, NP 04/12/19 1229

## 2019-04-11 NOTE — Discharge Instructions (Signed)
It was very nice seeing you today in clinic. Thank you for entrusting me with your care.  ° °Rest and stay HYDRATED. Water and electrolyte containing beverages (Gatorade, Pedialyte) are best to prevent dehydration and electrolyte abnormalities.  ° °Recommend warm salt water gargles, hard candies/lozenges, and hot tea with honey/lemon to help soothe the throat and reduce irritation.  ° °May use Tylenol and/or Ibuprofen as needed for pain/fever.  ° °You were tested for SARS-CoV-2 (novel coronavirus) today. Testing is performed by an outside lab ([facility]) and has variable turn around times ranging between 24-48 hours. Current recommendations from the the CDC and [place] DHHS require that you remain out of work in order to quarantine at home until negative test results are have been received. In the event that your test results are positive, you will be contacted with further directives. These measures are being implemented out of an abundance of caution to prevent transmission and spread during the current SARS-CoV-2 pandemic. ° °Make arrangements to follow up with your regular doctor in 1 week for re-evaluation if not improving. If your symptoms/condition worsens, please seek follow up care either here or in the ER. Please remember, our [facility] providers are "right here with you" when you need us.  ° °Again, it was my pleasure to take care of you today. Thank you for choosing our clinic. I hope that you start to feel better quickly.  ° °Kayshaun Polanco, MSN, APRN, FNP-C, CEN °Advanced Practice Provider °[facility] [place] [facility] °

## 2019-04-12 LAB — NOVEL CORONAVIRUS, NAA (HOSP ORDER, SEND-OUT TO REF LAB; TAT 18-24 HRS): SARS-CoV-2, NAA: DETECTED — AB

## 2019-04-13 ENCOUNTER — Encounter (HOSPITAL_COMMUNITY): Payer: Self-pay

## 2019-04-13 ENCOUNTER — Telehealth (HOSPITAL_COMMUNITY): Payer: Self-pay | Admitting: Emergency Medicine

## 2019-04-13 NOTE — Telephone Encounter (Signed)
Patient contacted by phone and made aware of  positive covid  results. Pt verbalized understanding and had all questions answered.    

## 2019-04-13 NOTE — Telephone Encounter (Signed)
Your test for COVID-19 was positive, meaning that you were infected with the novel coronavirus and could give the germ to others.  Please continue isolation at home for at least 10 days since the start of your symptoms. If you do not have symptoms, please isolate at home for 10 days from the day you were tested. Once you complete your 10 day quarantine, you may return to normal activities as long as you've not had a fever for over 24 hours(without taking fever reducing medicine) and your symptoms are improving. Please continue good preventive care measures, including:  frequent hand-washing, avoid touching your face, cover coughs/sneezes, stay out of crowds and keep a 6 foot distance from others.  Go to the nearest hospital emergency room if fever/cough/breathlessness are severe or illness seems like a threat to life.  Attempted to reach patient. No answer at this time. Voicemail not set up  If you have any questions, you may call me at [phone]

## 2019-08-06 ENCOUNTER — Other Ambulatory Visit: Payer: Self-pay

## 2019-08-06 ENCOUNTER — Encounter: Payer: Self-pay | Admitting: Emergency Medicine

## 2019-08-06 ENCOUNTER — Ambulatory Visit
Admission: EM | Admit: 2019-08-06 | Discharge: 2019-08-06 | Disposition: A | Discharge status: Home / Self Care | Payer: Commercial Managed Care - PPO | Attending: Family Medicine | Admitting: Family Medicine

## 2019-08-06 DIAGNOSIS — N3000 Acute cystitis without hematuria: Secondary | ICD-10-CM | POA: Diagnosis not present

## 2019-08-06 LAB — URINALYSIS, COMPLETE (UACMP) WITH MICROSCOPIC: WBC, UA: >50 WBC/hpf (ref 0–5)

## 2019-08-06 MED ORDER — CEPHALEXIN 500 MG PO CAPS: 500.0000 mg | ORAL_CAPSULE | Freq: Two times a day (BID) | ORAL | 0 refills | Status: DC

## 2019-08-06 NOTE — ED Triage Notes (Signed)
Pt c/o dysuria, frequency. Started about 2 days ago. Denies vaginal discharge, fever or lower back pain. She has taken Azo yesterday, but none today.

## 2019-08-06 NOTE — ED Provider Notes (Signed)
[facility]    CSN: [id number] Arrival date & time: 08/06/19  1109  History   Chief Complaint Chief Complaint  Patient presents with  . Dysuria   HPI  24 year old female presents with the above complaint.  Patient reports 2-day history of dysuria, urinary frequency, and urgency.  No back pain, flank pain, abdominal pain.  No vaginal discharge.  No concerns for STDs.  She has taken Azo as of yesterday.  Provides some relief.  No other associated symptoms.  No other complaints.  Past Surgical History:  Procedure Laterality Date  . NO PAST SURGERIES      OB History    Gravida  0   Para  0   Term  0   Preterm  0   AB  0   Living  0     SAB  0   TAB  0   Ectopic  0   Multiple  0   Live Births  0            Home Medications    Prior to Admission medications   Medication Sig Start Date End Date Taking? Authorizing Provider  etonogestrel (NEXPLANON) 68 MG IMPL implant 1 each by Subdermal route once.   Yes [provider]  cephALEXin (KEFLEX) 500 MG capsule Take 1 capsule (500 mg total) by mouth 2 (two) times daily. 08/06/19   Tommie Sams, DO    Family History Family History  Problem Relation Age of Onset  . Hypertension Father   . Diabetes Mellitus II Paternal Grandmother   . Breast cancer Maternal Aunt   . Healthy Mother     Social History Social History   Tobacco Use  . Smoking status: Former Smoker    Quit date: 03/28/2017    Years since quitting: 2.3  . Smokeless tobacco: Never Used  Substance Use Topics  . Alcohol use: No  . Drug use: No     Allergies   Patient has no known allergies.   Review of Systems Review of Systems  Constitutional: Negative for fever.  Gastrointestinal: Negative for abdominal pain.  Genitourinary: Positive for dysuria, frequency and urgency. Negative for flank pain.   Physical Exam Triage Vital Signs ED Triage Vitals  Enc Vitals Group     BP 08/06/19 1118 119/72     Pulse Rate  08/06/19 1118 98     Resp 08/06/19 1118 18     Temp 08/06/19 1118 99 F (37.2 C)     Temp Source 08/06/19 1118 Oral     SpO2 08/06/19 1118 98 %     Weight 08/06/19 1116 179 lb 14.3 oz (81.6 kg)     Height 08/06/19 1116 5\' 6"  (1.676 m)     Head Circumference --      Peak Flow --      Pain Score 08/06/19 1116 6     Pain Loc --      Pain Edu? --      Excl. in GC? --    Updated Vital Signs BP 119/72 (BP Location: Left Arm)   Pulse 98   Temp 99 F (37.2 C) (Oral)   Resp 18   Ht 5\' 6"  (1.676 m)   Wt 81.6 kg   SpO2 98%   BMI 29.04 kg/m   Visual Acuity Right Eye Distance:   Left Eye Distance:   Bilateral Distance:    Right Eye Near:   Left Eye Near:    Bilateral Near:  Physical Exam Vitals and nursing note reviewed.  Constitutional:      General: She is not in acute distress.    Appearance: Normal appearance. She is not ill-appearing.  HENT:     Head: Normocephalic and atraumatic.  Cardiovascular:     Rate and Rhythm: Normal rate and regular rhythm.     Heart sounds: No murmur.  Pulmonary:     Effort: Pulmonary effort is normal.     Breath sounds: Normal breath sounds. No wheezing, rhonchi or rales.  Abdominal:     General: There is no distension.     Palpations: Abdomen is soft.     Tenderness: There is no abdominal tenderness.  Neurological:     Mental Status: She is alert.  Psychiatric:        Mood and Affect: Mood normal.        Behavior: Behavior normal.    UC Treatments / Results  Labs (all labs ordered are listed, but only abnormal results are displayed) Labs Reviewed  URINALYSIS, COMPLETE (UACMP) WITH MICROSCOPIC - Abnormal; Notable for the following components:      Result Value   Color, Urine ORANGE (*)    APPearance CLOUDY (*)    Glucose, UA   (*)    Value: TEST NOT REPORTED DUE TO COLOR INTERFERENCE OF URINE PIGMENT   Hgb urine dipstick   (*)    Value: TEST NOT REPORTED DUE TO COLOR INTERFERENCE OF URINE PIGMENT   Bilirubin Urine   (*)      Value: TEST NOT REPORTED DUE TO COLOR INTERFERENCE OF URINE PIGMENT   Ketones, ur   (*)    Value: TEST NOT REPORTED DUE TO COLOR INTERFERENCE OF URINE PIGMENT   Protein, ur   (*)    Value: TEST NOT REPORTED DUE TO COLOR INTERFERENCE OF URINE PIGMENT   Nitrite   (*)    Value: TEST NOT REPORTED DUE TO COLOR INTERFERENCE OF URINE PIGMENT   Leukocytes,Ua   (*)    Value: TEST NOT REPORTED DUE TO COLOR INTERFERENCE OF URINE PIGMENT   Bacteria, UA RARE (*)    All other components within normal limits  URINE CULTURE    EKG   Radiology No results found.  Procedures Procedures (including critical care time)  Medications Ordered in UC Medications - No data to display  Initial Impression / Assessment and Plan / UC Course  I have reviewed the triage vital signs and the nursing notes.  Pertinent labs & imaging results that were available during my care of the patient were reviewed by me and considered in my medical decision making (see chart for details).     24 year old female presents with UTI.  Sending culture.  Placing on Keflex.  Final Clinical Impressions(s) / UC Diagnoses   Final diagnoses:  Acute cystitis without hematuria   Discharge Instructions   None    ED Prescriptions    Medication Sig Dispense Auth. Provider   cephALEXin (KEFLEX) 500 MG capsule Take 1 capsule (500 mg total) by mouth 2 (two) times daily. 14 capsule Thersa Salt G, DO     PDMP not reviewed this encounter.   Coral Spikes, [place] 08/06/19 1248

## 2019-08-07 LAB — URINE CULTURE

## 2019-11-29 ENCOUNTER — Telehealth: Payer: Self-pay | Admitting: Advanced Practice Midwife

## 2019-11-29 NOTE — Telephone Encounter (Signed)
Patient is scheduled for annual and nexplanon replacement on 12/19/19 with JEG

## 2019-12-17 NOTE — Telephone Encounter (Signed)
Noted. Nexplanon reserved for this patient. 

## 2019-12-19 ENCOUNTER — Other Ambulatory Visit (HOSPITAL_COMMUNITY)
Admission: RE | Admit: 2019-12-19 | Discharge: 2019-12-19 | Disposition: A | Discharge status: Home / Self Care | Payer: Commercial Managed Care - PPO | Source: Ambulatory Visit | Attending: Advanced Practice Midwife | Admitting: Advanced Practice Midwife

## 2019-12-19 ENCOUNTER — Ambulatory Visit (INDEPENDENT_AMBULATORY_CARE_PROVIDER_SITE_OTHER): Payer: Medicaid Other | Admitting: Advanced Practice Midwife

## 2019-12-19 ENCOUNTER — Other Ambulatory Visit: Payer: Self-pay

## 2019-12-19 ENCOUNTER — Encounter: Payer: Self-pay | Admitting: Advanced Practice Midwife

## 2019-12-19 VITALS — BP 122/74 | Ht 67.0 in | Wt 212.0 lb

## 2019-12-19 DIAGNOSIS — R300 Dysuria: Secondary | ICD-10-CM

## 2019-12-19 DIAGNOSIS — Z124 Encounter for screening for malignant neoplasm of cervix: Secondary | ICD-10-CM

## 2019-12-19 DIAGNOSIS — R3915 Urgency of urination: Secondary | ICD-10-CM | POA: Diagnosis not present

## 2019-12-19 DIAGNOSIS — Z01419 Encounter for gynecological examination (general) (routine) without abnormal findings: Secondary | ICD-10-CM | POA: Diagnosis present

## 2019-12-19 DIAGNOSIS — R350 Frequency of micturition: Secondary | ICD-10-CM | POA: Diagnosis not present

## 2019-12-19 MED ORDER — CEPHALEXIN 500 MG PO CAPS: 500.0000 mg | ORAL_CAPSULE | Freq: Three times a day (TID) | ORAL | 0 refills | Status: DC

## 2019-12-19 NOTE — Progress Notes (Addendum)
Gynecology Annual Exam  PCP: [facility]  Chief Complaint:  Chief Complaint  Patient presents with  . Annual Exam    History of Present Illness: Patient is a 24 y.o. G0P0000 presents for annual exam. The patient has no gyn complaints today. She reports UTI symptoms of burning, frequency and urgency for the past 2 days. She also mentions occasional heartburn. We discussed prophylactic and comfort measures for UTI/heartburn.   Her Nexplanon is due to be replaced in January of next year. She will schedule an appointment for that.   LMP: Patient's last menstrual period was 12/05/2019.  Average Interval: she has not had bleeding while on Nexplanon until recently and has had off and on bleeding for the past few weeks  Intermenstrual Bleeding: not applicable Postcoital Bleeding: no Dysmenorrhea: no  The patient is sexually active. She currently uses Nexplanon for contraception. She denies dyspareunia.  The patient does perform self breast exams.  There is no notable family history of breast or ovarian cancer in her family.  The patient wears seatbelts: yes.  The patient has regular exercise: she gets some exercise at her job: mostly lifting. She tries to eat healthy. She admits primarily being a juice drinker. She drinks a few small bottles of water per day. She gets about 5-6 hours of sleep per night.    The patient denies current symptoms of depression.    Review of Systems: Review of Systems  Constitutional: Negative for chills and fever.  HENT: Negative for congestion, ear discharge, ear pain, hearing loss, sinus pain and sore throat.   Eyes: Negative for blurred vision and double vision.  Respiratory: Negative for cough, shortness of breath and wheezing.   Cardiovascular: Negative for chest pain, palpitations and leg swelling.  Gastrointestinal: Positive for heartburn. Negative for abdominal pain, blood in stool, constipation, diarrhea, melena, nausea and  vomiting.  Genitourinary: Positive for dysuria, frequency and urgency. Negative for flank pain and hematuria.  Musculoskeletal: Negative for back pain, joint pain and myalgias.  Skin: Negative for itching and rash.  Neurological: Negative for dizziness, tingling, tremors, sensory change, speech change, focal weakness, seizures, loss of consciousness, weakness and headaches.  Endo/Heme/Allergies: Negative for environmental allergies. Does not bruise/bleed easily.  Psychiatric/Behavioral: Negative for depression, hallucinations, memory loss, substance abuse and suicidal ideas. The patient is not nervous/anxious and does not have insomnia.     Past Medical History:  There are no problems to display for this patient.   Past Surgical History:  Past Surgical History:  Procedure Laterality Date  . NO PAST SURGERIES      Gynecologic History:  Patient's last menstrual period was 12/05/2019. Contraception: Nexplanon Last Pap: first PAP smear today  Obstetric History: G0P0000  Family History:  Family History  Problem Relation Age of Onset  . Hypertension Father   . Diabetes Mellitus II Paternal Grandmother   . Breast cancer Maternal Aunt   . Healthy Mother     Social History:  Social History   Socioeconomic History  . Marital status: Single    Spouse name: Not on file  . Number of children: Not on file  . Years of education: Not on file  . Highest education level: Not on file  Occupational History  . Not on file  Tobacco Use  . Smoking status: Former Smoker    Quit date: 03/28/2017    Years since quitting: 2.7  . Smokeless tobacco: Never Used  Vaping Use  . Vaping Use: Every  day  . Substances: Nicotine, Flavoring  Substance and Sexual Activity  . Alcohol use: No  . Drug use: No  . Sexual activity: Yes    Birth control/protection: Condom  Other Topics Concern  . Not on file  Social History Narrative  . Not on file   Social Determinants of Health   Financial  Resource Strain:   . Difficulty of Paying Living Expenses: Not on file  Food Insecurity:   . Worried About Programme researcher, broadcasting/film/video in the Last Year: Not on file  . Ran Out of Food in the Last Year: Not on file  Transportation Needs:   . Lack of Transportation (Medical): Not on file  . Lack of Transportation (Non-Medical): Not on file  Physical Activity:   . Days of Exercise per Week: Not on file  . Minutes of Exercise per Session: Not on file  Stress:   . Feeling of Stress : Not on file  Social Connections:   . Frequency of Communication with Friends and Family: Not on file  . Frequency of Social Gatherings with Friends and Family: Not on file  . Attends Religious Services: Not on file  . Active Member of Clubs or Organizations: Not on file  . Attends Banker Meetings: Not on file  . Marital Status: Not on file  Intimate Partner Violence:   . Fear of Current or Ex-Partner: Not on file  . Emotionally Abused: Not on file  . Physically Abused: Not on file  . Sexually Abused: Not on file    Allergies:  Allergies  Allergen Reactions  . Pollen Extract Other (See Comments)    Itchy, water eyes, stuffy nose, congestion Itchy, water eyes, stuffy nose, congestion    Medications: Prior to Admission medications   Medication Sig Start Date End Date Taking? Authorizing Provider  etonogestrel (NEXPLANON) 68 MG IMPL implant 1 each by Subdermal route once.   Yes [provider]  cephALEXin (KEFLEX) 500 MG capsule Take 1 capsule (500 mg total) by mouth 3 (three) times daily. 12/19/19   Tresea Mall, CNM    Physical Exam Vitals: Blood pressure 122/74, height 5\' 7"  (1.702 m), weight 212 lb (96.2 kg), last menstrual period 12/05/2019.  General: NAD HEENT: normocephalic, anicteric Thyroid: no enlargement, no palpable nodules Pulmonary: No increased work of breathing, CTAB Cardiovascular: RRR, distal pulses 2+ Breast: Breast symmetrical, no tenderness, no palpable  nodules or masses, no skin or nipple retraction present, no nipple discharge.  No axillary or supraclavicular lymphadenopathy. Abdomen: NABS, soft, non-tender, non-distended.  Umbilicus without lesions.  No hepatomegaly, splenomegaly or masses palpable. No evidence of hernia  Genitourinary:  External: Normal external female genitalia.  Normal urethral meatus, normal Bartholin's and Skene's glands.    Vagina: Normal vaginal mucosa, no evidence of prolapse.    Cervix: Grossly normal in appearance, scant bleeding, no CMT  Uterus: Non-enlarged, mobile, normal contour.    Adnexa: ovaries non-enlarged, no adnexal masses  Rectal: deferred  Lymphatic: no evidence of inguinal lymphadenopathy Extremities: no edema, erythema, or tenderness Neurologic: Grossly intact Psychiatric: mood appropriate, affect full    Assessment: 24 y.o. G0P0000 routine annual exam  Plan: Problem List Items Addressed This Visit    None    Visit Diagnoses    Well woman exam with routine gynecological exam    -  Primary   Relevant Orders   Cytology - PAP   Cervical cancer screening       Relevant Orders   Cytology - PAP  Dysuria       Relevant Medications   cephALEXin (KEFLEX) 500 MG capsule   Other Relevant Orders   Urine Culture   Urinary frequency       Relevant Medications   cephALEXin (KEFLEX) 500 MG capsule   Other Relevant Orders   Urine Culture   Urinary urgency       Relevant Medications   cephALEXin (KEFLEX) 500 MG capsule   Other Relevant Orders   Urine Culture      1) 4) Gardasil Series discussed and if applicable offered to patient - Patient has not previously completed 3 shot series   2) STI screening  was offered and declined  3)  ASCCP guidelines and rationale discussed.  Patient opts for beginning today and every 3 years if normal PAP screening interval  4) Contraception - the patient is currently using  Nexplanon.  She is happy with her current form of contraception and plans to  continue We discussed safe sex practices to reduce her furture risk of STI's.    5) Return in about 3 months (around 03/20/2020) for nexplanon removal/reinsertion.     Tresea Mall, CNM [facility] 12/19/2019, 5:02 PM

## 2019-12-19 NOTE — Patient Instructions (Signed)
Health Maintenance, Female Adopting a healthy lifestyle and getting preventive care are important in promoting health and wellness. Ask your health care provider about:  The right schedule for you to have regular tests and exams.  Things you can do on your own to prevent diseases and keep yourself healthy. What should I know about diet, weight, and exercise? Eat a healthy diet   Eat a diet that includes plenty of vegetables, fruits, low-fat dairy products, and lean protein.  Do not eat a lot of foods that are high in solid fats, added sugars, or sodium. Maintain a healthy weight Body mass index (BMI) is used to identify weight problems. It estimates body fat based on height and weight. Your health care provider can help determine your BMI and help you achieve or maintain a healthy weight. Get regular exercise Get regular exercise. This is one of the most important things you can do for your health. Most adults should:  Exercise for at least 150 minutes each week. The exercise should increase your heart rate and make you sweat (moderate-intensity exercise).  Do strengthening exercises at least twice a week. This is in addition to the moderate-intensity exercise.  Spend less time sitting. Even light physical activity can be beneficial. Watch cholesterol and blood lipids Have your blood tested for lipids and cholesterol at 22 years of age, then have this test every 5 years. Have your cholesterol levels checked more often if:  Your lipid or cholesterol levels are high.  You are older than 22 years of age.  You are at high risk for heart disease. What should I know about cancer screening? Depending on your health history and family history, you may need to have cancer screening at various ages. This may include screening for:  Breast cancer.  Cervical cancer.  Colorectal cancer.  Skin cancer.  Lung cancer. What should I know about heart disease, diabetes, and high blood  pressure? Blood pressure and heart disease  High blood pressure causes heart disease and increases the risk of stroke. This is more likely to develop in people who have high blood pressure readings, are of African descent, or are overweight.  Have your blood pressure checked: ? Every 3-5 years if you are 18-39 years of age. ? Every year if you are 40 years old or older. Diabetes Have regular diabetes screenings. This checks your fasting blood sugar level. Have the screening done:  Once every three years after age 40 if you are at a normal weight and have a low risk for diabetes.  More often and at a younger age if you are overweight or have a high risk for diabetes. What should I know about preventing infection? Hepatitis B If you have a higher risk for hepatitis B, you should be screened for this virus. Talk with your health care provider to find out if you are at risk for hepatitis B infection. Hepatitis C Testing is recommended for:  Everyone born from 1945 through 1965.  Anyone with known risk factors for hepatitis C. Sexually transmitted infections (STIs)  Get screened for STIs, including gonorrhea and chlamydia, if: ? You are sexually active and are younger than 22 years of age. ? You are older than 22 years of age and your health care provider tells you that you are at risk for this type of infection. ? Your sexual activity has changed since you were last screened, and you are at increased risk for chlamydia or gonorrhea. Ask your health care provider if   you are at risk.  Ask your health care provider about whether you are at high risk for HIV. Your health care provider may recommend a prescription medicine to help prevent HIV infection. If you choose to take medicine to prevent HIV, you should first get tested for HIV. You should then be tested every 3 months for as long as you are taking the medicine. Pregnancy  If you are about to stop having your period (premenopausal) and  you may become pregnant, seek counseling before you get pregnant.  Take 400 to 800 micrograms (mcg) of folic acid every day if you become pregnant.  Ask for birth control (contraception) if you want to prevent pregnancy. Osteoporosis and menopause Osteoporosis is a disease in which the bones lose minerals and strength with aging. This can result in bone fractures. If you are 65 years old or older, or if you are at risk for osteoporosis and fractures, ask your health care provider if you should:  Be screened for bone loss.  Take a calcium or vitamin D supplement to lower your risk of fractures.  Be given hormone replacement therapy (HRT) to treat symptoms of menopause. Follow these instructions at home: Lifestyle  Do not use any products that contain nicotine or tobacco, such as cigarettes, e-cigarettes, and chewing tobacco. If you need help quitting, ask your health care provider.  Do not use street drugs.  Do not share needles.  Ask your health care provider for help if you need support or information about quitting drugs. Alcohol use  Do not drink alcohol if: ? Your health care provider tells you not to drink. ? You are pregnant, may be pregnant, or are planning to become pregnant.  If you drink alcohol: ? Limit how much you use to 0-1 drink a day. ? Limit intake if you are breastfeeding.  Be aware of how much alcohol is in your drink. In the [place] one drink equals one 12 oz bottle of beer (355 mL), one 5 oz glass of wine (148 mL), or one 1 oz glass of hard liquor (44 mL). General instructions  Schedule regular health, dental, and eye exams.  Stay current with your vaccines.  Tell your health care provider if: ? You often feel depressed. ? You have ever been abused or do not feel safe at home. Summary  Adopting a healthy lifestyle and getting preventive care are important in promoting health and wellness.  Follow your health care provider's instructions about healthy  diet, exercising, and getting tested or screened for diseases.  Follow your health care provider's instructions on monitoring your cholesterol and blood pressure. This information is not intended to replace advice given to you by your health care provider. Make sure you discuss any questions you have with your health care provider. Document Revised: 02/08/2018 Document Reviewed: 02/08/2018 Elsevier Patient Education  2020 [employer]  

## 2019-12-22 LAB — URINE CULTURE

## 2019-12-24 LAB — CYTOLOGY - PAP

## 2020-03-11 ENCOUNTER — Telehealth: Payer: Self-pay | Admitting: Advanced Practice Midwife

## 2020-03-11 NOTE — Telephone Encounter (Signed)
Pt will need nexplanon on 03/20/20 with JEG AT 10:30 AM.

## 2020-03-20 ENCOUNTER — Encounter: Payer: Self-pay | Admitting: Advanced Practice Midwife

## 2020-03-20 ENCOUNTER — Other Ambulatory Visit: Payer: Self-pay

## 2020-03-20 ENCOUNTER — Ambulatory Visit (INDEPENDENT_AMBULATORY_CARE_PROVIDER_SITE_OTHER): Payer: Commercial Managed Care - PPO | Admitting: Advanced Practice Midwife

## 2020-03-20 VITALS — BP 120/80 | Ht 67.0 in | Wt 207.0 lb

## 2020-03-20 DIAGNOSIS — Z30017 Encounter for initial prescription of implantable subdermal contraceptive: Secondary | ICD-10-CM | POA: Diagnosis not present

## 2020-03-20 DIAGNOSIS — Z3046 Encounter for surveillance of implantable subdermal contraceptive: Secondary | ICD-10-CM | POA: Diagnosis not present

## 2020-03-20 NOTE — Progress Notes (Signed)
GYNECOLOGY PROCEDURE NOTE  Nexplanon removal discussed in detail.  Risks of infection, bleeding, nerve injury all reviewed.  Patient understands risks and desires to proceed.  Verbal consent obtained.  Patient is certain she wants the Nexplanon removed.  All questions answered.  Review of Systems  Constitutional: Negative for chills and fever.  HENT: Negative for congestion, ear discharge, ear pain, hearing loss, sinus pain and sore throat.   Eyes: Negative for blurred vision and double vision.  Respiratory: Negative for cough, shortness of breath and wheezing.   Cardiovascular: Negative for chest pain, palpitations and leg swelling.  Gastrointestinal: Negative for abdominal pain, blood in stool, constipation, diarrhea, heartburn, melena, nausea and vomiting.  Genitourinary: Negative for dysuria, flank pain, frequency, hematuria and urgency.  Musculoskeletal: Negative for back pain, joint pain and myalgias.  Skin: Negative for itching and rash.  Neurological: Negative for dizziness, tingling, tremors, sensory change, speech change, focal weakness, seizures, loss of consciousness, weakness and headaches.  Endo/Heme/Allergies: Negative for environmental allergies. Does not bruise/bleed easily.  Psychiatric/Behavioral: Negative for depression, hallucinations, memory loss, substance abuse and suicidal ideas. The patient is not nervous/anxious and does not have insomnia.     BP 120/80   Ht 5\' 7"  (1.702 m)   Wt 207 lb (93.9 kg)   BMI 32.42 kg/m    Procedure: Patient placed in dorsal supine with left arm above head, elbow flexed at 90 degrees, arm resting on examination table.  Nexplanon identified without problems.  Betadine scrub x 2.  2 ml of 1% lidocaine injected under Nexplanon device without problems.  Sterile gloves applied.  Small 0.5cm incision made at distal tip of Nexplanon device with 11 blade scalpel.  Nexplanon brought to incision and grasped with a small kelly clamp.  Nexplanon  removed intact without problems.  Pressure applied to incision.  Patient tolerated procedure well.  No complications.   Assessment: 25 y.o. year old female now s/p uncomplicated Nexplanon removal.  Plan: 1.  Patient given post procedure precautions and asked to call for fever, chills, redness or drainage from her incision, bleeding from incision.  She understands she will likely have a small bruise near site of removal and can remove bandage tomorrow and steri-strips in approximately 1 week.  2) Contraception: Nexplanon re-insertion- see next note  J2001 for lidocaine block, 21 for nexplanon removal     GYNECOLOGY PROCEDURE NOTE  Patient is a 25 y.o. G0P0000 presenting for Nexplanon insertion as her desired means of contraception.  She provided informed consent, signed copy in the chart, time out was performed. No LMP recorded. Patient has had an implant.  She understands that Nexplanon is a progesterone only therapy, and that patients often have irregular and unpredictable vaginal bleeding or amenorrhea. She understands that other side effects are possible related to systemic progesterone, including but not limited to, headaches, breast tenderness, nausea, and irritability. While effective at preventing pregnancy long acting reversible contraceptives do not prevent transmission of sexually transmitted diseases and use of barrier methods for this purpose was discussed. The placement procedure for Nexplanon was reviewed with the patient in detail including risks of nerve injury, infection, bleeding and injury to other muscles or tendons. She understands that the Nexplanon implant is good for 3 years and needs to be removed at the end of that time.  She understands that Nexplanon is an extremely effective option for contraception, with failure rate of <1%. This information is reviewed today and all questions were answered. Informed consent was obtained, both verbally  and written.   The patient  is healthy and has no contraindications to Nexplanon use.   Procedure Appropriate time out taken.  Patient placed in dorsal supine with left arm above head, elbow flexed at 90 degrees, arm resting on examination table.   Nexplanon removed form sterile blister packaging,  Device confirmed in needle, before inserting full length of needle, tenting up the skin as the needle was advanced.  The drug eluding rod was then deployed by pulling back the slider per the manufactures recommendation.  The implant was palpable by the clinician as well as the patient.  The insertion site covered dressed with a steri strip and band aid before applying  a kerlex bandage pressure dressing..Minimal blood loss was noted during the procedure.  The patient tolerated the procedure well.   She was instructed to wear the bandage for 24 hours, call with any signs of infection.  She was given the Nexplanon card and instructed to have the rod removed in 3 years.   Parke Poisson, CNM [facility] 03/20/20, 11:10 AM    Charge [phone] for nexplanon device, CPT [id number] for procedure J2001 for lidocaine administration Modifer 25, plus Modifer 79 is done during a global billing visit

## 2020-06-06 ENCOUNTER — Encounter: Payer: Self-pay | Admitting: Emergency Medicine

## 2020-06-06 ENCOUNTER — Ambulatory Visit
Admission: EM | Admit: 2020-06-06 | Discharge: 2020-06-06 | Disposition: A | Discharge status: Home / Self Care | Payer: Commercial Managed Care - PPO | Attending: Emergency Medicine | Admitting: Emergency Medicine

## 2020-06-06 ENCOUNTER — Other Ambulatory Visit: Payer: Self-pay

## 2020-06-06 DIAGNOSIS — J329 Chronic sinusitis, unspecified: Secondary | ICD-10-CM | POA: Diagnosis present

## 2020-06-06 DIAGNOSIS — Z20822 Contact with and (suspected) exposure to covid-19: Secondary | ICD-10-CM | POA: Insufficient documentation

## 2020-06-06 DIAGNOSIS — B9789 Other viral agents as the cause of diseases classified elsewhere: Secondary | ICD-10-CM | POA: Insufficient documentation

## 2020-06-06 MED ORDER — FLUTICASONE PROPIONATE 50 MCG/ACT NA SUSP: 2.0000 | Freq: Every day | NASAL | 0 refills | Status: DC

## 2020-06-06 MED ORDER — IBUPROFEN 600 MG PO TABS: 600.0000 mg | ORAL_TABLET | Freq: Four times a day (QID) | ORAL | 0 refills | Status: DC | PRN

## 2020-06-06 NOTE — ED Provider Notes (Addendum)
HPI  SUBJECTIVE:  Krista Logan is a 25 y.o. female who presents with 3 days of sinus pain and pressure, cough, nasal congestion, mucoid rhinorrhea.  She reports a frontal sinus headache, mild body aches and sore throat secondary to the postnasal drip.  She reports shortness of breath present with coughing only.  No fevers above 100.4, upper dental pain, facial swelling, wheezing.  No loss of sense of smell or taste, nausea, vomiting, diarrhea, abdominal pain.  No known flu or Covid exposure.  She did not get the flu or Covid vaccines.  No allergy symptoms.  No antibiotics in the past month. No Antipyretic in the past 6 hours.  She has tried taking steamy showers and an unknown allergy nasal spray without improvement in her symptoms.  No aggravating factors.  She has a past medical history of COVID in 2020.  LMP: Has a Nexplanon.  Denies possibility of being pregnant.  PMD: None.    History reviewed. No pertinent past medical history.  Past Surgical History:  Procedure Laterality Date  . NO PAST SURGERIES      Family History  Problem Relation Age of Onset  . Hypertension Father   . Diabetes Mellitus II Paternal Grandmother   . Breast cancer Maternal Aunt   . Healthy Mother     Social History   Tobacco Use  . Smoking status: Former Smoker    Quit date: 03/28/2017    Years since quitting: 3.1  . Smokeless tobacco: Never Used  Vaping Use  . Vaping Use: Former  . Substances: Nicotine, Flavoring  Substance Use Topics  . Alcohol use: No  . Drug use: No    No current facility-administered medications for this encounter.  Current Outpatient Medications:  .  etonogestrel (NEXPLANON) 68 MG IMPL implant, 1 each by Subdermal route once., Disp: , Rfl:  .  fluticasone (FLONASE) 50 MCG/ACT nasal spray, Place 2 sprays into both nostrils daily., Disp: 16 g, Rfl: 0 .  ibuprofen (ADVIL) 600 MG tablet, Take 1 tablet (600 mg total) by mouth every 6 (six) hours as needed., Disp: 30 tablet,  Rfl: 0  Allergies  Allergen Reactions  . Pollen Extract Other (See Comments)    Itchy, water eyes, stuffy nose, congestion Itchy, water eyes, stuffy nose, congestion     ROS  As noted in HPI.   Physical Exam  BP 133/90 (BP Location: Left Arm)   Pulse 81   Temp 98.2 F (36.8 C) (Oral)   Resp 14   Ht 5\' 7"  (1.702 m)   Wt 86.2 kg   SpO2 100%   BMI 29.76 kg/m   Constitutional: Well developed, well nourished, no acute distress Eyes:  EOMI, conjunctiva normal bilaterally HENT: Normocephalic, atraumatic,mucus membranes moist.  Positive mucoid nasal congestion.  Positive mild maxillary sinus tenderness.  No frontal sinus tenderness.  Erythematous, swollen turbinates.  Normal oropharynx.  No obvious postnasal drip. Neck: No cervical lymphadenopathy Respiratory: Normal inspiratory effort, lungs clear bilaterally  cardiovascular: Normal rate regular rhythm, no murmurs rubs or gallops GI: nondistended skin: No rash, skin intact Musculoskeletal: no deformities Neurologic: Alert & oriented x 3, no focal neuro deficits Psychiatric: Speech and behavior appropriate   ED Course   Medications - No data to display  Orders Placed This Encounter  Procedures  . SARS CORONAVIRUS 2 (TAT 6-24 HRS) Nasopharyngeal Nasopharyngeal Swab    Standing Status:   Standing    Number of Occurrences:   1    Order Specific Question:  Is this test for diagnosis or screening    Answer:   Diagnosis of ill patient    Order Specific Question:   Symptomatic for COVID-19 as defined by CDC    Answer:   Yes    Order Specific Question:   Date of Symptom Onset    Answer:   06/03/2020    Order Specific Question:   Hospitalized for COVID-19    Answer:   No    Order Specific Question:   Admitted to ICU for COVID-19    Answer:   No    Order Specific Question:   Previously tested for COVID-19    Answer:   Yes    Order Specific Question:   Resident in a congregate (group) care setting    Answer:   No    Order  Specific Question:   Employed in healthcare setting    Answer:   No    Order Specific Question:   Pregnant    Answer:   No    Order Specific Question:   Has patient completed COVID vaccination(s) (2 doses of Pfizer/Moderna 1 dose of [employer])    Answer:   No    Results for orders placed or performed during the hospital encounter of 06/06/20 (from the past 24 hour(s))  SARS CORONAVIRUS 2 (TAT 6-24 HRS) Nasopharyngeal Nasopharyngeal Swab     Status: None   Collection Time: 06/06/20  1:28 PM   Specimen: Nasopharyngeal Swab  Result Value Ref Range   SARS Coronavirus 2 NEGATIVE NEGATIVE   No results found.  ED Clinical Impression  1. Viral sinusitis   2. Encounter for laboratory testing for COVID-19 virus      ED Assessment/Plan  COVID sent.  Flu deferred as this appears to be more of a viral sinusitis rather than influenza.  Sent home with Mucinex D, Flonase, saline nasal irrigation, work note for today Monday.  She does not work over the weekend.  Will provide primary care list for ongoing care and order assistance of finding a PMD.  Discussed with patient that antibiotics are not indicated at this time.   Discussed labs, MDM, treatment plan, and plan for follow-up with patient.  patient agrees with plan.   Addendum 06/07/20 1109-Covid negative.  Meds ordered this encounter  Medications  . fluticasone (FLONASE) 50 MCG/ACT nasal spray    Sig: Place 2 sprays into both nostrils daily.    Dispense:  16 g    Refill:  0  . ibuprofen (ADVIL) 600 MG tablet    Sig: Take 1 tablet (600 mg total) by mouth every 6 (six) hours as needed.    Dispense:  30 tablet    Refill:  0      *This clinic note was created using Scientist, clinical (histocompatibility and immunogenetics). Therefore, there may be occasional mistakes despite careful proofreading.  ?    Domenick Gong, MD 06/06/20 1708    Domenick Gong, MD 06/07/20 1110

## 2020-06-06 NOTE — Discharge Instructions (Addendum)
COVID test will be back in 6 to 24 hours.  We ended up not testing for flu, however we can do this if you want.  Take the medication as written. Start Mucinex-D to keep the mucous thin and to decongest you.  Return to the ER if you get worse, have a fever >100.4, or for any concerns. You may take 600 mg of motrin with 1 gram of tylenol up to 3-4 times a day as needed for pain. This is an effective combination for pain.  Most sinus infections are viral and do not need antibiotics unless you have a high fever, have had this for 10 days, or you get better and then get sick again. Use a NeilMed sinus rinse as often as you want to to reduce nasal congestion. Follow the directions on the box.   Go to www.goodrx.com to look up your medications. This will give you a list of where you can find your prescriptions at the most affordable prices. Or you can ask the pharmacist what the cash price is. This is frequently cheaper than going through insurance. Here is a list of primary care providers who are taking new patients:  Dr. Elizabeth Sauer [address] [place] [address] [phone]  [facility] at [facility] [address] [place], [place] [address] [phone]  [facility] [place] [address] [place] [address]  [phone]  [facility] [address] [place], [place] [address] [phone]  [facility] [address] [place]  [phone] [place], [place] [address]  Here are clinics/ other resources who will see you if you do not have insurance. Some have certain criteria that you must meet. Call them and find out what they are:  [facility]: [address] [place], [place] [address] Phone: [phone] Hours: First and Third Saturdays of each Month, 9 a.m. - 1 p.m.  [facility]: [address] [place], [place] [address] Phone: [phone] Hours: Tuesday, 4 p.m. - 8 p.m. Thursday, 1 p.m. - 8 p.m. Wednesday, 9 a.m. -  [facility] [address], [place], [place] [address] Phone: [phone] Pharmacy Phone Number: [phone] Dental Phone Number: [phone] [facility] Insurance Help: [phone]  Dental Hours: Monday - Thursday, 8 a.m. - 6 p.m.  [facility] [address], [place], [place] [address] Phone: [phone] Pharmacy Phone Number: [phone] [facility] Insurance Help: [phone]  [facility] [address] [place] [place], [place] [address] Phone: [phone] Pharmacy Phone Number: [phone] [facility] Insurance Help: [phone]  [facility] [address] [place], [place] [address] Phone: [phone] [facility] Insurance Help: [phone]   [facility] [address], [place], [place] [address] Phone: [phone]  Go to www.goodrx.com to look up your medications. This will give you a list of where you can find your prescriptions at the most affordable prices. Or ask the pharmacist what the cash price is, or if they have any other discount programs available to help make your medication more affordable. This can be less expensive than what you would pay with insurance.

## 2020-06-06 NOTE — ED Triage Notes (Signed)
Patient c/o cough, runny nose, sinus pressure, bodyaches, and headache that started on Tuesday. Patient denies fevers recently.

## 2020-06-07 LAB — SARS CORONAVIRUS 2 (TAT 6-24 HRS): SARS Coronavirus 2: NEGATIVE

## 2020-06-10 ENCOUNTER — Other Ambulatory Visit: Payer: Self-pay

## 2020-06-10 ENCOUNTER — Encounter: Payer: Self-pay | Admitting: Emergency Medicine

## 2020-06-10 ENCOUNTER — Ambulatory Visit
Admission: EM | Admit: 2020-06-10 | Discharge: 2020-06-10 | Disposition: A | Discharge status: Home / Self Care | Payer: Commercial Managed Care - PPO | Attending: Family Medicine | Admitting: Family Medicine

## 2020-06-10 DIAGNOSIS — N3001 Acute cystitis with hematuria: Secondary | ICD-10-CM | POA: Insufficient documentation

## 2020-06-10 HISTORY — DX: Other seasonal allergic rhinitis: J30.2

## 2020-06-10 LAB — URINALYSIS, COMPLETE (UACMP) WITH MICROSCOPIC
Bilirubin Urine: NEGATIVE
Glucose, UA: NEGATIVE mg/dL
Ketones, ur: NEGATIVE mg/dL
Nitrite: NEGATIVE
Protein, ur: NEGATIVE mg/dL
Specific Gravity, Urine: 1.01 (ref 1.005–1.030)
WBC, UA: >50 WBC/hpf (ref 0–5)
pH: 6.5 (ref 5.0–8.0)

## 2020-06-10 MED ORDER — CEPHALEXIN 500 MG PO CAPS: 500.0000 mg | ORAL_CAPSULE | Freq: Two times a day (BID) | ORAL | 0 refills | Status: DC

## 2020-06-10 NOTE — ED Provider Notes (Signed)
[facility]    CSN: [id number] Arrival date & time: 06/10/20  1752  History   Chief Complaint Chief Complaint  Patient presents with  . Urinary Frequency  . Dysuria  . Abdominal Pain    LLQ   HPI  25 year old female presents with the above complaints.  Symptoms x4 days.  She reports dysuria and urinary frequency.  Also reports lower abdominal pain.  No fever.  No flank pain.  She has taken Azo without relief.  Pain 8/10 in severity.  No other complaints.  Past Medical History:  Diagnosis Date  . Seasonal allergies    Past Surgical History:  Procedure Laterality Date  . NO PAST SURGERIES      OB History    Gravida  0   Para  0   Term  0   Preterm  0   AB  0   Living  0     SAB  0   IAB  0   Ectopic  0   Multiple  0   Live Births  0            Home Medications    Prior to Admission medications   Medication Sig Start Date End Date Taking? Authorizing Provider  cephALEXin (KEFLEX) 500 MG capsule Take 1 capsule (500 mg total) by mouth 2 (two) times daily. 06/10/20  Yes Adabelle Griffiths G, DO  etonogestrel (NEXPLANON) 68 MG IMPL implant 1 each by Subdermal route once.   Yes [provider]  fluticasone (FLONASE) 50 MCG/ACT nasal spray Place 2 sprays into both nostrils daily. 06/06/20  Yes Domenick Gong, MD  ibuprofen (ADVIL) 600 MG tablet Take 1 tablet (600 mg total) by mouth every 6 (six) hours as needed. 06/06/20  Yes Domenick Gong, MD    Family History Family History  Problem Relation Age of Onset  . Hypertension Father   . Diabetes Mellitus II Paternal Grandmother   . Breast cancer Maternal Aunt   . Congestive Heart Failure Mother     Social History Social History   Tobacco Use  . Smoking status: Former Smoker    Quit date: 03/28/2017    Years since quitting: 3.2  . Smokeless tobacco: Never Used  Vaping Use  . Vaping Use: Former  . Quit date: 03/28/2017  . Substances: Nicotine, Flavoring  Substance Use Topics   . Alcohol use: No  . Drug use: No     Allergies   Pollen extract   Review of Systems Review of Systems Per HPI  Physical Exam Triage Vital Signs ED Triage Vitals  Enc Vitals Group     BP 06/10/20 1758 (!) 131/91     Pulse Rate 06/10/20 1758 95     Resp 06/10/20 1758 18     Temp 06/10/20 1758 99 F (37.2 C)     Temp Source 06/10/20 1758 Oral     SpO2 06/10/20 1758 100 %     Weight 06/10/20 1758 190 lb (86.2 kg)     Height 06/10/20 1758 5\' 7"  (1.702 m)     Head Circumference --      Peak Flow --      Pain Score 06/10/20 1757 8     Pain Loc --      Pain Edu? --      Excl. in GC? --    Updated Vital Signs BP (!) 131/91 (BP Location: Left Arm)   Pulse 95   Temp 99 F (37.2 C) (Oral)  Resp 18   Ht 5\' 7"  (1.702 m)   Wt 86.2 kg   SpO2 100%   BMI 29.76 kg/m   Visual Acuity Right Eye Distance:   Left Eye Distance:   Bilateral Distance:    Right Eye Near:   Left Eye Near:    Bilateral Near:     Physical Exam Vitals and nursing note reviewed.  Constitutional:      General: She is not in acute distress.    Appearance: She is well-developed. She is not ill-appearing.  HENT:     Head: Normocephalic and atraumatic.  Eyes:     General:        Right eye: No discharge.        Left eye: No discharge.     Conjunctiva/sclera: Conjunctivae normal.  Cardiovascular:     Rate and Rhythm: Normal rate and regular rhythm.  Pulmonary:     Effort: Pulmonary effort is normal.     Breath sounds: Normal breath sounds. No wheezing, rhonchi or rales.  Abdominal:     General: There is no distension.     Palpations: Abdomen is soft.     Comments: Mild tenderness in the suprapubic region and LLQ.  Neurological:     Mental Status: She is alert.    UC Treatments / Results  Labs (all labs ordered are listed, but only abnormal results are displayed) Labs Reviewed  URINALYSIS, COMPLETE (UACMP) WITH MICROSCOPIC - Abnormal; Notable for the following components:      Result  Value   APPearance HAZY (*)    Hgb urine dipstick MODERATE (*)    Leukocytes,Ua LARGE (*)    Bacteria, UA FEW (*)    All other components within normal limits  URINE CULTURE    EKG   Radiology No results found.  Procedures Procedures (including critical care time)  Medications Ordered in UC Medications - No data to display  Initial Impression / Assessment and Plan / UC Course  I have reviewed the triage vital signs and the nursing notes.  Pertinent labs & imaging results that were available during my care of the patient were reviewed by me and considered in my medical decision making (see chart for details).    25 year old female presents with UTI.  Treating with Keflex.  Sending culture.   Final Clinical Impressions(s) / UC Diagnoses   Final diagnoses:  Acute cystitis with hematuria   Discharge Instructions   None    ED Prescriptions    Medication Sig Dispense Auth. Provider   cephALEXin (KEFLEX) 500 MG capsule Take 1 capsule (500 mg total) by mouth 2 (two) times daily. 14 capsule 21 G, DO     PDMP not reviewed this encounter.   Everlene Other, Tommie Sams 06/10/20 1912

## 2020-06-10 NOTE — ED Triage Notes (Signed)
Patient in today c/o urinary frequency and pain x 4 days and LLQ abdominal pain that started today. Patient denies fever. Patient has taken OTC AZO, last dose this morning. Patient denies any vaginal symptoms.

## 2020-06-13 LAB — URINE CULTURE: Culture: 50000 — AB

## 2020-09-11 ENCOUNTER — Other Ambulatory Visit: Payer: Self-pay

## 2020-09-11 ENCOUNTER — Encounter: Payer: Self-pay | Admitting: Emergency Medicine

## 2020-09-11 ENCOUNTER — Ambulatory Visit
Admission: EM | Admit: 2020-09-11 | Discharge: 2020-09-11 | Disposition: A | Discharge status: Home / Self Care | Payer: Commercial Managed Care - PPO

## 2020-09-11 DIAGNOSIS — M26609 Unspecified temporomandibular joint disorder, unspecified side: Secondary | ICD-10-CM

## 2020-09-11 DIAGNOSIS — R220 Localized swelling, mass and lump, head: Secondary | ICD-10-CM | POA: Diagnosis not present

## 2020-09-11 NOTE — ED Provider Notes (Signed)
[facility]    CSN: [id number] Arrival date & time: 09/11/20  1513      History   Chief Complaint Chief Complaint  Patient presents with  . Jaw Pain    HPI Krista Logan is a 25 y.o. female presenting for small swollen lump near her right ear that she noticed this morning.  She says that specific area is tender.  There has been no injury.  She denies any diffuse jaw pain.  She denies any ear pain or drainage.  No headaches, dizziness or nausea/vomiting.  Patient denies any cough, congestion or sore throat.  No sinus pain.  No dental pain.  Patient has not iced the area or applied heat and has not taken any medications for pain relief.  Denies similar problem in the past.  Patient does deny history of grinding her teeth.  No other complaints or concerns.  HPI  Past Medical History:  Diagnosis Date  . Seasonal allergies     There are no problems to display for this patient.   Past Surgical History:  Procedure Laterality Date  . NO PAST SURGERIES      OB History     Gravida  0   Para  0   Term  0   Preterm  0   AB  0   Living  0      SAB  0   IAB  0   Ectopic  0   Multiple  0   Live Births  0            Home Medications    Prior to Admission medications   Medication Sig Start Date End Date Taking? Authorizing Provider  etonogestrel (NEXPLANON) 68 MG IMPL implant 1 each by Subdermal route once.    [provider]  ibuprofen (ADVIL) 600 MG tablet Take 1 tablet (600 mg total) by mouth every 6 (six) hours as needed. 06/06/20   Domenick Gong, MD    Family History Family History  Problem Relation Age of Onset  . Hypertension Father   . Diabetes Mellitus II Paternal Grandmother   . Breast cancer Maternal Aunt   . Congestive Heart Failure Mother     Social History Social History   Tobacco Use  . Smoking status: Former    Types: Cigarettes    Quit date: 03/28/2017    Years since quitting: 3.4  . Smokeless  tobacco: Never  Vaping Use  . Vaping Use: Former  . Quit date: 03/28/2017  . Substances: Nicotine, Flavoring  Substance Use Topics  . Alcohol use: No  . Drug use: No     Allergies   Pollen extract   Review of Systems Review of Systems  Constitutional:  Negative for chills, diaphoresis, fatigue and fever.  HENT:  Positive for facial swelling. Negative for congestion, ear pain, rhinorrhea, sinus pressure, sinus pain and sore throat.   Respiratory:  Negative for cough.   Gastrointestinal:  Negative for nausea and vomiting.  Musculoskeletal:  Negative for arthralgias and myalgias.  Skin:  Negative for rash.  Neurological:  Negative for weakness and headaches.  Hematological:  Negative for adenopathy.    Physical Exam Triage Vital Signs ED Triage Vitals [09/11/20 1523]  Enc Vitals Group     BP      Pulse      Resp      Temp      Temp src      SpO2      Weight  190 lb 0.6 oz (86.2 kg)     Height 5\' 7"  (1.702 m)     Head Circumference      Peak Flow      Pain Score      Pain Loc      Pain Edu?      Excl. in GC?    No data found.  Updated Vital Signs BP 122/84 (BP Location: Right Arm)   Pulse 69   Temp 98.6 F (37 C) (Oral)   Resp 18   Ht 5\' 7"  (1.702 m)   Wt 190 lb 0.6 oz (86.2 kg)   LMP  (LMP Unknown)   SpO2 100%   BMI 29.76 kg/m      Physical Exam Vitals and nursing note reviewed.  Constitutional:      General: She is not in acute distress.    Appearance: Normal appearance. She is not ill-appearing or toxic-appearing.  HENT:     Head: Normocephalic and atraumatic.     Jaw: Swelling present.      Comments: Very small area of swelling jaw line on right as indicated in photo. Clicking/popping and discomfort at right TMJ    Right Ear: Tympanic membrane, ear canal and external ear normal.     Left Ear: Tympanic membrane, ear canal and external ear normal.     Nose: Nose normal.     Mouth/Throat:     Mouth: Mucous membranes are moist.     Pharynx:  Oropharynx is clear.  Eyes:     General: No scleral icterus.       Right eye: No discharge.        Left eye: No discharge.     Conjunctiva/sclera: Conjunctivae normal.  Cardiovascular:     Rate and Rhythm: Normal rate and regular rhythm.     Heart sounds: Normal heart sounds.  Pulmonary:     Effort: Pulmonary effort is normal. No respiratory distress.     Breath sounds: Normal breath sounds.  Musculoskeletal:     Cervical back: Neck supple.  Lymphadenopathy:     Cervical: No cervical adenopathy.  Skin:    General: Skin is dry.  Neurological:     General: No focal deficit present.     Mental Status: She is alert. Mental status is at baseline.     Motor: No weakness.     Gait: Gait normal.  Psychiatric:        Mood and Affect: Mood normal.        Behavior: Behavior normal.        Thought Content: Thought content normal.     UC Treatments / Results  Labs (all labs ordered are listed, but only abnormal results are displayed) Labs Reviewed - No data to display  EKG   Radiology No results found.  Procedures Procedures (including critical care time)  Medications Ordered in UC Medications - No data to display  Initial Impression / Assessment and Plan / UC Course  I have reviewed the triage vital signs and the nursing notes.  Pertinent labs & imaging results that were available during my care of the patient were reviewed by me and considered in my medical decision making (see chart for details).  25 year old female presenting for small swollen lump near the right ear.  On exam she does have a very small swollen area of the right jawline.  There is clicking and popping to the right TMJ.  The remainder the exam is within normal limits.  Advised patient  I suspect the swelling is likely due to inflammation from her TMJ.  Advised icing the area and taking ibuprofen or Aleve and Tylenol.  Advised following up with dentist or PCP if the discomfort or swelling continues.  ED  precautions reviewed.   Final Clinical Impressions(s) / UC Diagnoses   Final diagnoses:  Jaw swelling  TMJ (temporomandibular joint disorder)     Discharge Instructions      The swollen area is likely related to inflammation of your jaw.  You do have clicking/popping when you open your jaw.  You should ice this area and you can take over-the-counter ibuprofen or Aleve to help with inflammation.  Try to avoid any hard or crunchy foods.  Avoid chewing gum.  If this issue persists you may need to follow-up with a dentist or your primary care provider.     ED Prescriptions   None    PDMP not reviewed this encounter.   Shirlee Latch, PA-C 09/11/20 1545

## 2020-09-11 NOTE — ED Triage Notes (Signed)
Pt c/o right sided jaw tenderness and feels a lump in the area. Started this morning. No dental pain or ear pain.

## 2020-09-11 NOTE — Discharge Instructions (Addendum)
The swollen area is likely related to inflammation of your jaw.  You do have clicking/popping when you open your jaw.  You should ice this area and you can take over-the-counter ibuprofen or Aleve to help with inflammation.  Try to avoid any hard or crunchy foods.  Avoid chewing gum.  If this issue persists you may need to follow-up with a dentist or your primary care provider.

## 2020-09-26 ENCOUNTER — Ambulatory Visit
Admission: EM | Admit: 2020-09-26 | Discharge: 2020-09-26 | Disposition: A | Discharge status: Home / Self Care | Payer: Commercial Managed Care - PPO | Attending: Emergency Medicine | Admitting: Emergency Medicine

## 2020-09-26 ENCOUNTER — Encounter: Payer: Self-pay | Admitting: Emergency Medicine

## 2020-09-26 ENCOUNTER — Other Ambulatory Visit: Payer: Self-pay

## 2020-09-26 DIAGNOSIS — M26609 Unspecified temporomandibular joint disorder, unspecified side: Secondary | ICD-10-CM | POA: Diagnosis not present

## 2020-09-26 DIAGNOSIS — H66001 Acute suppurative otitis media without spontaneous rupture of ear drum, right ear: Secondary | ICD-10-CM

## 2020-09-26 MED ORDER — AMOXICILLIN-POT CLAVULANATE 875-125 MG PO TABS: 1.0000 | ORAL_TABLET | Freq: Two times a day (BID) | ORAL | 0 refills | Status: AC

## 2020-09-26 MED ORDER — METHYLPREDNISOLONE 4 MG PO TBPK: ORAL_TABLET | ORAL | 0 refills | Status: DC

## 2020-09-26 NOTE — ED Provider Notes (Signed)
[facility]    CSN: [id number] Arrival date & time: 09/26/20  1505      History   Chief Complaint Chief Complaint  Patient presents with   Otalgia    right    HPI Krista Logan is a 25 y.o. female.   HPI  25 year old female here for evaluation of right ear and jaw pain.  Patient reports that she has been having right ear pain for last 2 days and developed pain in her right jaw yesterday.  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  She states that she is having ringing in her right ear as well as muffled hearing.  She denies fever, drainage from the ear, dizziness, or other upper respiratory symptoms.  Past Medical History:  Diagnosis Date   Seasonal allergies     There are no problems to display for this patient.   Past Surgical History:  Procedure Laterality Date   NO PAST SURGERIES      OB History     Gravida  0   Para  0   Term  0   Preterm  0   AB  0   Living  0      SAB  0   IAB  0   Ectopic  0   Multiple  0   Live Births  0            Home Medications    Prior to Admission medications   Medication Sig Start Date End Date Taking? Authorizing Provider  amoxicillin-clavulanate (AUGMENTIN) 875-125 MG tablet Take 1 tablet by mouth every 12 (twelve) hours for 10 days. 09/26/20 10/06/20 Yes Becky Augusta, NP  etonogestrel (NEXPLANON) 68 MG IMPL implant 1 each by Subdermal route once.   Yes [provider]  ibuprofen (ADVIL) 600 MG tablet Take 1 tablet (600 mg total) by mouth every 6 (six) hours as needed. 06/06/20  Yes Domenick Gong, MD  methylPREDNISolone (MEDROL DOSEPAK) 4 MG TBPK tablet Take according to the package insert. 09/26/20  Yes Becky Augusta, NP    Family History Family History  Problem Relation Age of Onset   Hypertension Father    Diabetes Mellitus II Paternal Grandmother    Breast cancer Maternal Aunt    Congestive  Heart Failure Mother     Social History Social History   Tobacco Use   Smoking status: Former    Types: Cigarettes    Quit date: 03/28/2017    Years since quitting: 3.5   Smokeless tobacco: Never  Vaping Use   Vaping Use: Some days   Last attempt to quit: 03/28/2017   Substances: Nicotine, Flavoring  Substance Use Topics   Alcohol use: No   Drug use: No     Allergies   Pollen extract   Review of Systems Review of Systems  Constitutional:  Negative for activity change, appetite change and fever.  HENT:  Positive for ear pain and tinnitus. Negative for congestion, ear discharge, facial swelling, rhinorrhea and sore throat.   Respiratory:  Negative for shortness of breath and wheezing.   Musculoskeletal:  Positive for arthralgias.  Skin:  Negative for rash.  Hematological: Negative.   Psychiatric/Behavioral: Negative.      Physical Exam Triage Vital Signs ED Triage Vitals  Enc Vitals Group     BP 09/26/20 1515 (!) 131/91     Pulse Rate 09/26/20  1515 81     Resp 09/26/20 1515 14     Temp 09/26/20 1515 98.7 F (37.1 C)     Temp Source 09/26/20 1515 Oral     SpO2 09/26/20 1515 99 %     Weight 09/26/20 1513 215 lb (97.5 kg)     Height 09/26/20 1513 5\' 7"  (1.702 m)     Head Circumference --      Peak Flow --      Pain Score 09/26/20 1513 8     Pain Loc --      Pain Edu? --      Excl. in GC? --    No data found.  Updated Vital Signs BP (!) 131/91 (BP Location: Right Arm)   Pulse 81   Temp 98.7 F (37.1 C) (Oral)   Resp 14   Ht 5\' 7"  (1.702 m)   Wt 215 lb (97.5 kg)   LMP  (LMP Unknown)   SpO2 99%   BMI 33.67 kg/m   Visual Acuity Right Eye Distance:   Left Eye Distance:   Bilateral Distance:    Right Eye Near:   Left Eye Near:    Bilateral Near:     Physical Exam Vitals and nursing note reviewed.  Constitutional:      General: She is not in acute distress.    Appearance: Normal appearance. She is obese. She is not ill-appearing.  HENT:      Head: Normocephalic and atraumatic.     Right Ear: Ear canal and external ear normal. There is no impacted cerumen.     Left Ear: Tympanic membrane, ear canal and external ear normal. There is no impacted cerumen.  Skin:    General: Skin is warm and dry.     Capillary Refill: Capillary refill takes less than 2 seconds.     Findings: No erythema or rash.  Neurological:     General: No focal deficit present.     Mental Status: She is alert and oriented to person, place, and time.  Psychiatric:        Mood and Affect: Mood normal.        Behavior: Behavior normal.        Thought Content: Thought content normal.        Judgment: Judgment normal.     UC Treatments / Results  Labs (all labs ordered are listed, but only abnormal results are displayed) Labs Reviewed - No data to display  EKG   Radiology No results found.  Procedures Procedures (including critical care time)  Medications Ordered in UC Medications - No data to display  Initial Impression / Assessment and Plan / UC Course  I have reviewed the triage vital signs and the nursing notes.  Pertinent labs & imaging results that were available during my care of the patient were reviewed by me and considered in my medical decision making (see chart for details).  Patient is a pleasant, nontoxic-appearing 89 old female here for evaluation of right ear pain and right jaw pain that been going on for the past 2 days as outlined HPI above.  Patient's physical exam reveals pearly gray tympanic membrane on the left with a normal light reflex and clear external auditory canal.  Right tympanic membrane is erythematous and injected with a loss of landmarks.  External auditory canals clear.  Patient does have tenderness to palpation of the right TMJ but there is no palpable swelling.  The mandibular joint tracks normally with opening and  closing of the mouth.  Patient's exam is consistent with otitis media and TMJ inflammation.  We will  treat with Augmentin and Medrol Dosepak.  Patient advised to follow-up with her dentist to have a nightguard made to help prevent grinding or clenching at night which will decrease TMJ inflammation.  Boss advised patient to avoid crunchy foods, chewing gum, or chewing ice until the inflammation resolves and only follow a soft diet.   Final Clinical Impressions(s) / UC Diagnoses   Final diagnoses:  Non-recurrent acute suppurative otitis media of right ear without spontaneous rupture of tympanic membrane  TMJ (temporomandibular joint disorder)     Discharge Instructions      Take the Augmentin twice daily for 10 days with food for treatment of your ear infection.  Take an over-the-counter probiotic 1 hour after each dose of antibiotic to prevent diarrhea.  Use over-the-counter Tylenol and ibuprofen as needed for pain or fever.  Place a hot water bottle, or heating pad, underneath your pillowcase at night to help dilate up your ear and aid in pain relief as well as resolution of the infection.  Return for reevaluation for any new or worsening symptoms.  Start the steroid pack in the morning for your TMJ.  Avoid hard, crunchy foods, chewing gum or ice as these will increase inflammation in your jaw joint.Eat only soft foods until your pain improves.   Talk with your dentist about possibly being fitted for a mouth guard to prevent clenching or grinding of your teeth at night.      ED Prescriptions     Medication Sig Dispense Auth. Provider   amoxicillin-clavulanate (AUGMENTIN) 875-125 MG tablet Take 1 tablet by mouth every 12 (twelve) hours for 10 days. 20 tablet Becky Augusta, NP   methylPREDNISolone (MEDROL DOSEPAK) 4 MG TBPK tablet Take according to the package insert. 1 each Becky Augusta, NP      PDMP not reviewed this encounter.   Becky Augusta, NP 09/26/20 1539

## 2020-09-26 NOTE — ED Triage Notes (Signed)
Patient c/o right ear pain that started on Wed.  Patient denies fevers.

## 2020-09-26 NOTE — Discharge Instructions (Addendum)
Take the Augmentin twice daily for 10 days with food for treatment of your ear infection.  Take an over-the-counter probiotic 1 hour after each dose of antibiotic to prevent diarrhea.  Use over-the-counter Tylenol and ibuprofen as needed for pain or fever.  Place a hot water bottle, or heating pad, underneath your pillowcase at night to help dilate up your ear and aid in pain relief as well as resolution of the infection.  Return for reevaluation for any new or worsening symptoms.  Start the steroid pack in the morning for your TMJ.  Avoid hard, crunchy foods, chewing gum or ice as these will increase inflammation in your jaw joint.Eat only soft foods until your pain improves.   Talk with your dentist about possibly being fitted for a mouth guard to prevent clenching or grinding of your teeth at night.

## 2020-12-23 NOTE — Telephone Encounter (Signed)
Nexplanon not rcvd/not due for exchange until 03/2020.

## 2020-12-25 NOTE — Telephone Encounter (Signed)
Nexplanon rcvd/charged 03/20/20

## 2021-05-11 IMAGING — CR DG KNEE COMPLETE 4+V*R*
4 series · 4 of 4 positions shown · non-contrast
Comparison: No prior.

CLINICAL DATA: Right knee pain.

EXAM:
RIGHT KNEE - COMPLETE 4+ VIEW

[knee ap]
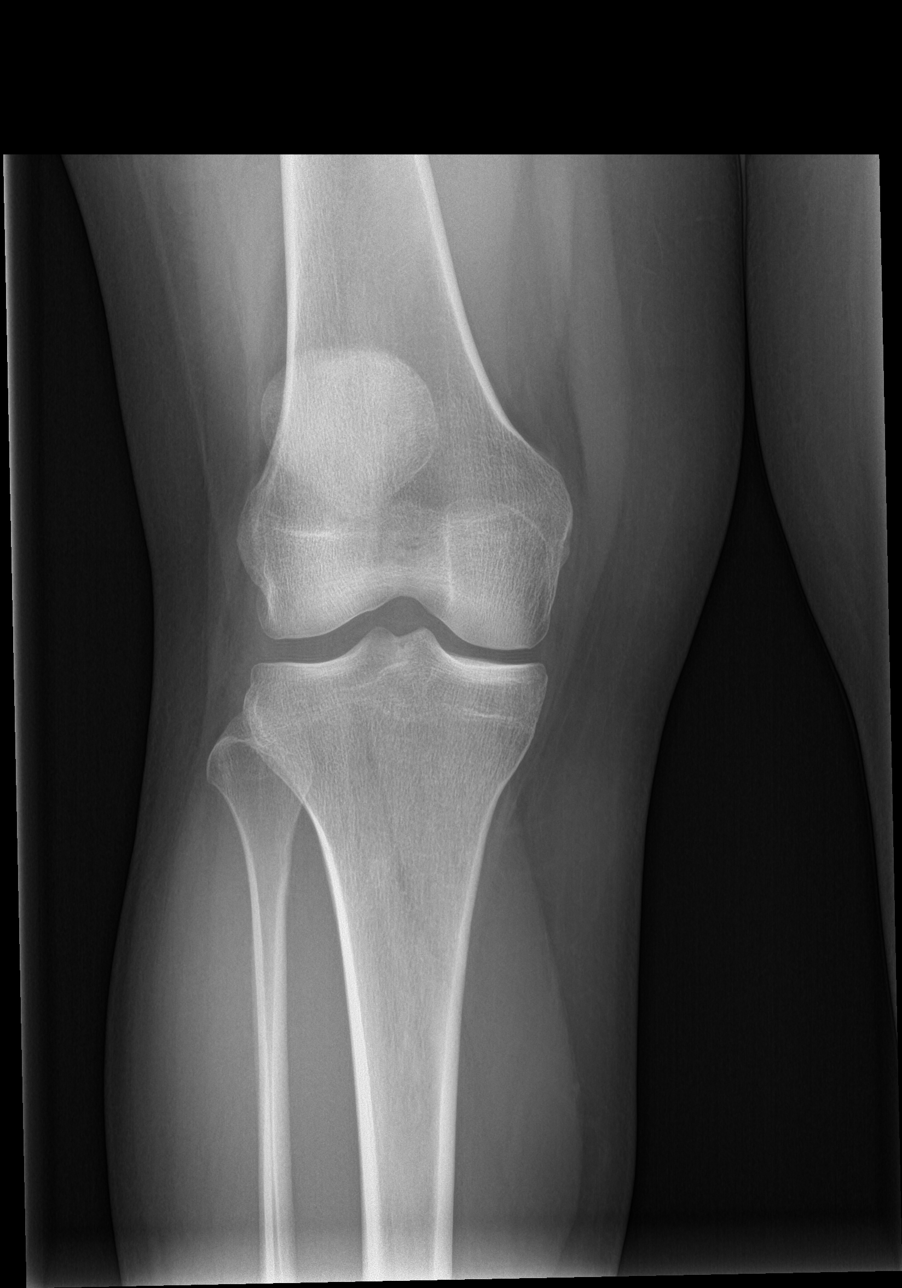

[knee lat]
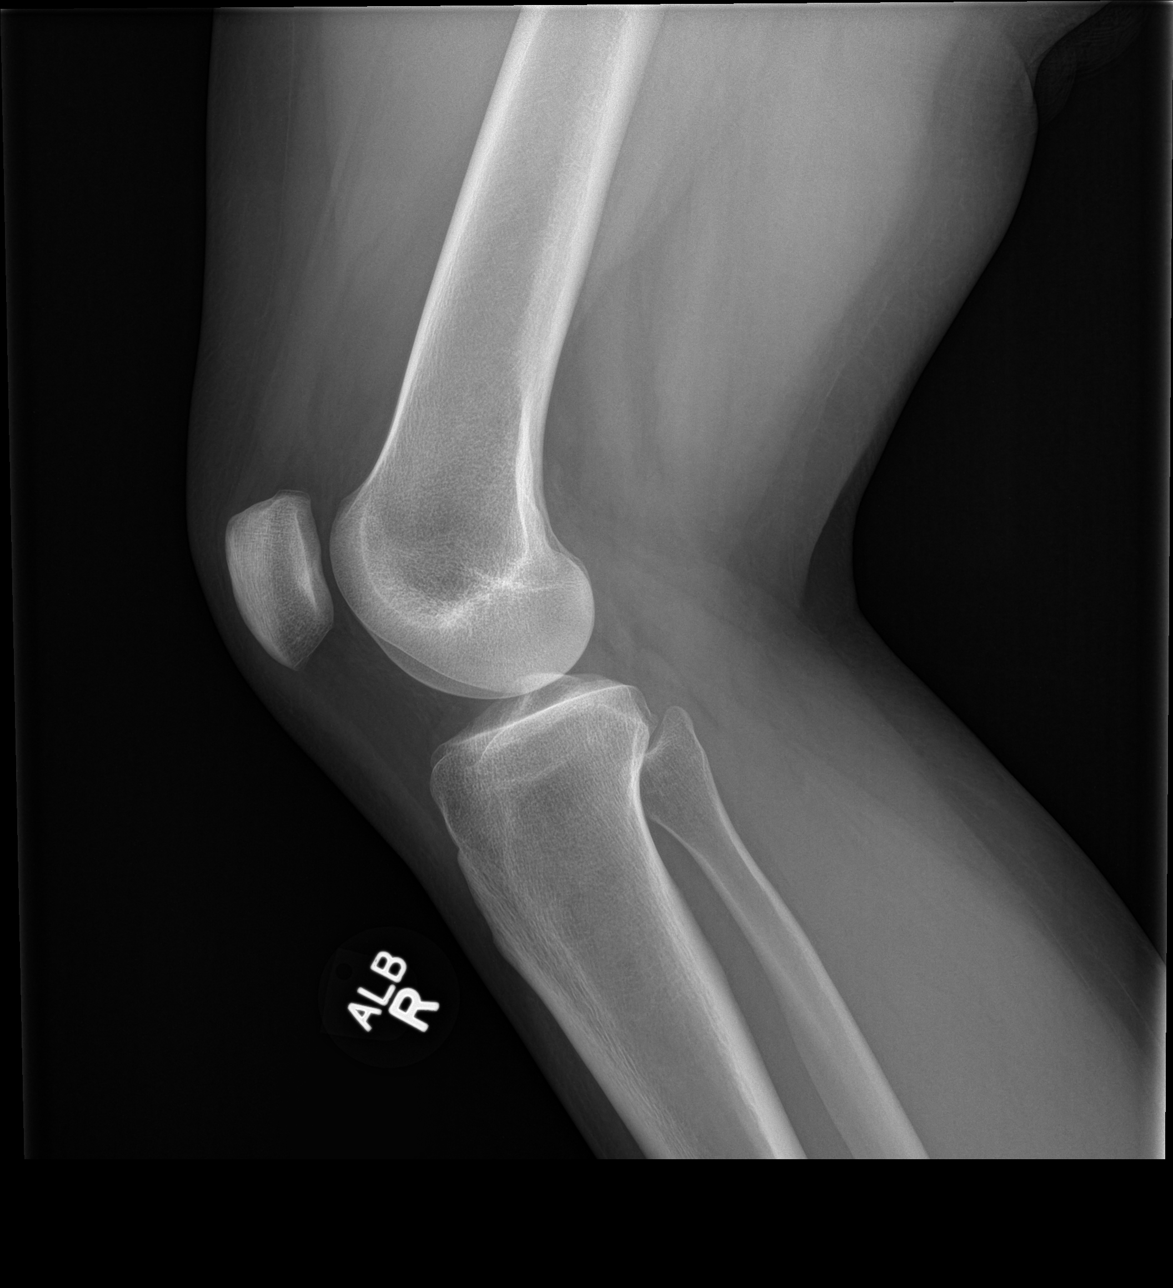

[tunnel]
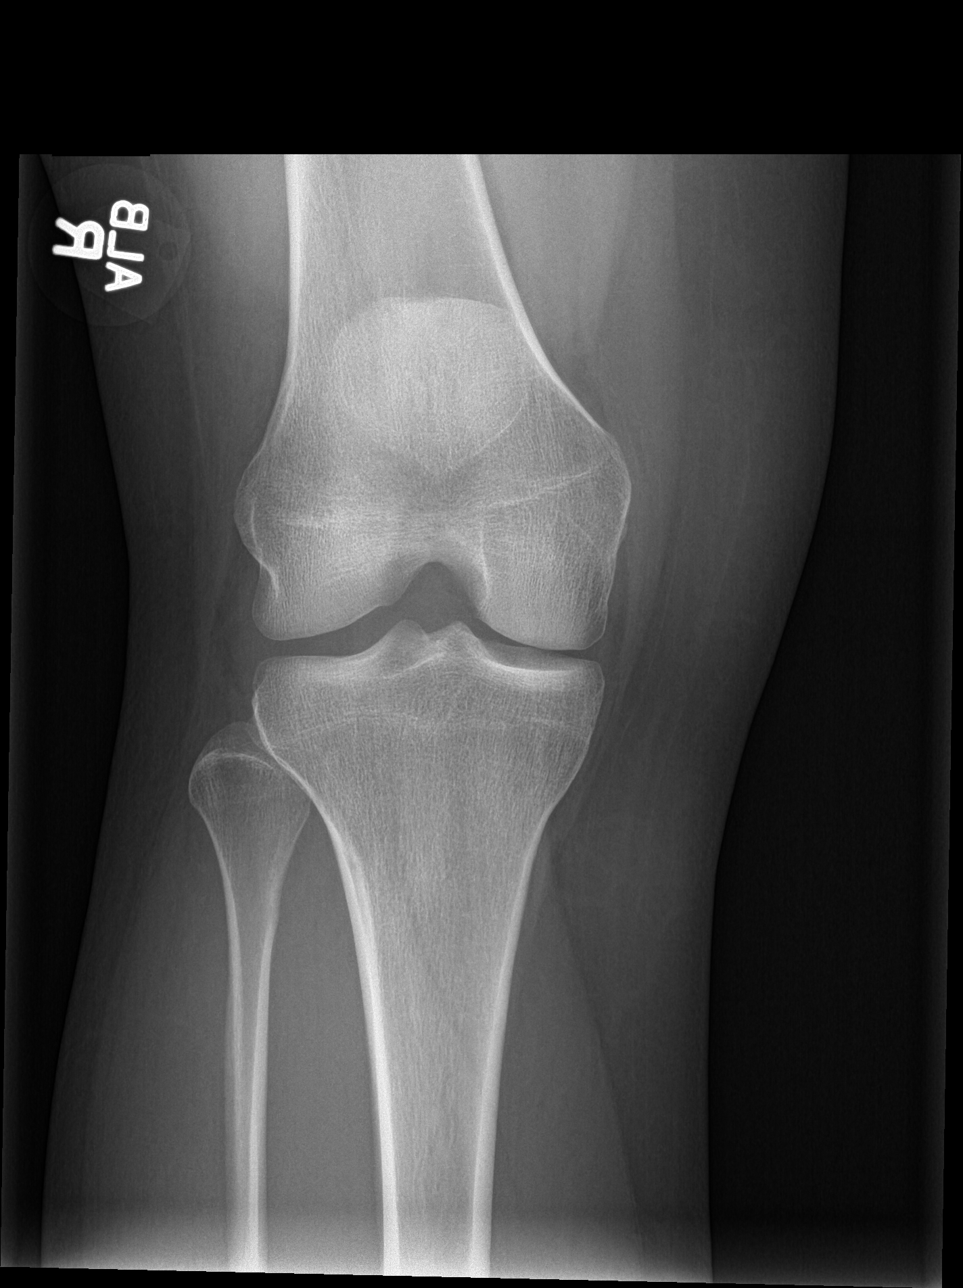

[patella skyline]
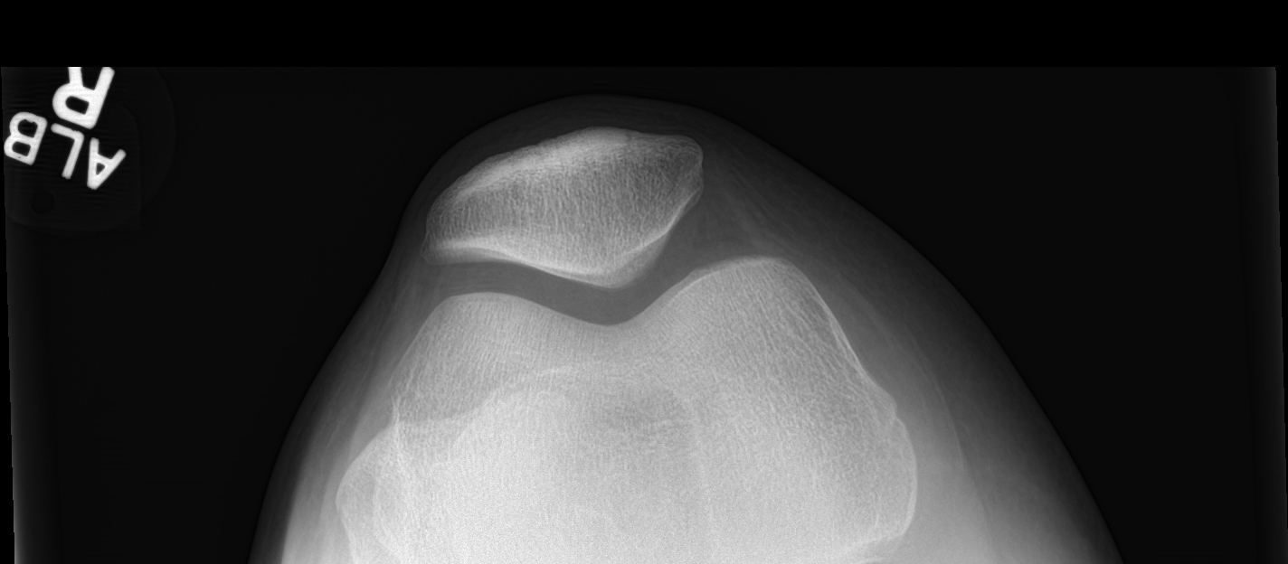

[4 of 4 positions shown; findings below may reference images not displayed]

FINDINGS: No acute soft tissue bony abnormality. No evidence of fracture or
dislocation. No evidence of effusion.
IMPRESSION: No acute bony or joint abnormality.

## 2021-06-03 ENCOUNTER — Ambulatory Visit
Admission: EM | Admit: 2021-06-03 | Discharge: 2021-06-03 | Disposition: A | Discharge status: Home / Self Care | Payer: No Typology Code available for payment source | Attending: Physician Assistant | Admitting: Physician Assistant

## 2021-06-03 ENCOUNTER — Other Ambulatory Visit: Payer: Self-pay

## 2021-06-03 DIAGNOSIS — M79602 Pain in left arm: Secondary | ICD-10-CM | POA: Diagnosis not present

## 2021-06-03 MED ORDER — METHYLPREDNISOLONE 4 MG PO TBPK: ORAL_TABLET | ORAL | 0 refills | Status: DC

## 2021-06-03 NOTE — ED Provider Notes (Signed)
?[facility] ? ? ? ?CSN: [id number] ?Arrival date & time: 06/03/21  1936 ? ? ?  ? ?History   ?Chief Complaint ?Chief Complaint  ?Patient presents with  ? Arm Pain  ?  LEFT ARM   ? ? ?HPI ?Krista Logan is a 26 y.o. female presenting for approximately 1 week history of aching left ventral forearm pain.  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  Denies any specific injury but says pain has developed gradually and seems to be getting worse.  Patient is right-handed.  Admits to increased pain when trying to lift something and when extending forearm.  Has tried ibuprofen and icing the area without any improvement in symptoms.  Denies any pain about the elbow or wrist and no numbness or tingling.  No similar present past.  No other complaints. ? ?HPI ? ?Past Medical History:  ?Diagnosis Date  ? Seasonal allergies   ? ? ?There are no problems to display for this patient. ? ? ?Past Surgical History:  ?Procedure Laterality Date  ? NO PAST SURGERIES    ? ? ?OB History   ? ? Gravida  ?0  ? Para  ?0  ? Term  ?0  ? Preterm  ?0  ? AB  ?0  ? Living  ?0  ?  ? ? SAB  ?0  ? IAB  ?0  ? Ectopic  ?0  ? Multiple  ?0  ? Live Births  ?0  ?   ?  ?  ? ? ? ?Home Medications   ? ?Prior to Admission medications   ?Medication Sig Start Date End Date Taking? Authorizing Provider  ?methylPREDNISolone (MEDROL DOSEPAK) 4 MG TBPK tablet Take according to Dosepak instructions 06/03/21  Yes Shirlee Latch, PA-C  ?etonogestrel (NEXPLANON) 68 MG IMPL implant 1 each by Subdermal route once.    [provider]  ?ibuprofen (ADVIL) 600 MG tablet Take 1 tablet (600 mg total) by mouth every 6 (six) hours as needed. 06/06/20   Domenick Gong, MD  ? ? ?Family History ?Family History  ?Problem Relation Age of Onset  ? Hypertension Father   ? Diabetes Mellitus II Paternal Grandmother   ? Breast cancer Maternal Aunt   ? Congestive Heart Failure Mother   ? ? ?Social  History ?Social History  ? ?Tobacco Use  ? Smoking status: Former  ?  Types: Cigarettes  ?  Quit date: 03/28/2017  ?  Years since quitting: 4.1  ? Smokeless tobacco: Never  ?Vaping Use  ? Vaping Use: Some days  ? Last attempt to quit: 03/28/2017  ? Substances: Nicotine, Flavoring  ?Substance Use Topics  ? Alcohol use: No  ? Drug use: No  ? ? ? ?Allergies   ?Pollen extract ? ? ?Review of Systems ?Review of Systems  ?Musculoskeletal:  Positive for arthralgias. Negative for joint swelling and neck pain.  ?     Left forearm pain  ?Skin:  Negative for color change, rash and wound.  ?Neurological:  Negative for weakness and numbness.  ? ? ?Physical Exam ?Triage Vital Signs ?ED Triage Vitals  ?Enc Vitals Group  ?   BP 06/03/21 1955 (!) 146/85  ?   Pulse Rate 06/03/21 1955 83  ?   Resp 06/03/21 1955 16  ?   Temp 06/03/21 1955 98.7 ?F (37.1 ?C)  ?   Temp Source 06/03/21 1955 Oral  ?  SpO2 06/03/21 1955 100 %  ?   Weight --   ?   Height --   ?   Head Circumference --   ?   Peak Flow --   ?   Pain Score 06/03/21 1954 7  ?   Pain Loc --   ?   Pain Edu? --   ?   Excl. in GC? --   ? ?No data found. ? ?Updated Vital Signs ?BP (!) 146/85 (BP Location: Right Arm)   Pulse 83   Temp 98.7 ?F (37.1 ?C) (Oral)   Resp 16   SpO2 100%  ?  ? ?Physical Exam ?Vitals and nursing note reviewed.  ?Constitutional:   ?   General: She is not in acute distress. ?   Appearance: Normal appearance. She is not ill-appearing or toxic-appearing.  ?HENT:  ?   Head: Normocephalic and atraumatic.  ?Eyes:  ?   General: No scleral icterus.    ?   Right eye: No discharge.     ?   Left eye: No discharge.  ?   Conjunctiva/sclera: Conjunctivae normal.  ?Cardiovascular:  ?   Rate and Rhythm: Normal rate and regular rhythm.  ?   Pulses: Normal pulses.  ?Pulmonary:  ?   Effort: Pulmonary effort is normal. No respiratory distress.  ?Musculoskeletal:  ?   Cervical back: Neck supple.  ?   Comments: Left forearm: There is a small amount of swelling over the medial  proximal forearm/flexor tendons.  Tenderness to palpation in this region.  No bony tenderness.  No tenderness palpation of any bony aspect of the wrist or elbow and full range of motion of wrist and elbow.  Some increased discomfort when fully extending at elbow.  Strength and sensation.  ?Skin: ?   General: Skin is dry.  ?Neurological:  ?   General: No focal deficit present.  ?   Mental Status: She is alert. Mental status is at baseline.  ?   Motor: No weakness.  ?   Gait: Gait normal.  ?Psychiatric:     ?   Mood and Affect: Mood normal.     ?   Behavior: Behavior normal.     ?   Thought Content: Thought content normal.  ? ? ? ?UC Treatments / Results  ?Labs ?(all labs ordered are listed, but only abnormal results are displayed) ?Labs Reviewed - No data to display ? ?EKG ? ? ?Radiology ?No results found. ? ?Procedures ?Procedures (including critical care time) ? ?Medications Ordered in UC ?Medications - No data to display ? ?Initial Impression / Assessment and Plan / UC Course  ?I have reviewed the triage vital signs and the nursing notes. ? ?Pertinent labs & imaging results that were available during my care of the patient were reviewed by me and considered in my medical decision making (see chart for details). ? ?26 year old female presenting for 1 week history of left forearm pain.  No specific injury but she does lift 30 to 40 pound boxes and tosses them frequently at work.  Denies work comp injury.  Has tried ibuprofen and cryotherapy without improvement in symptoms.  On exam she does have tenderness to palpation of the medial ventral forearm about the flexor tendons.  Full range of motion of the elbow and wrist without any tenderness palpation of any bony areas and no swelling.  Good strength and sensation.  Suspect patient may have strained muscles of forearm.  Sent Medrol Dosepak since NSAIDs are not helping.  Also vies Tylenol, heat, topical muscle rubs.  Work note provided for today.  She will have the  next 4 to 5 days off to recover.  Advised her to be careful when lifting boxes in the future and avoid tossing them.  Advised her if no improvement over the next 1 week to follow-up with Ortho. ? ?Final Clinical Impressions(s) / UC Diagnoses  ? ?Final diagnoses:  ?Left arm pain  ? ? ? ?Discharge Instructions   ? ?  ?PAIN: You likely pulled some muscles in your arm.  Stressed avoiding painful activities . Reviewed RICE guidelines. Use medications as directed, including prednisone, Tylenol, topical muscle rubs, lidocaine patches.  Try heat and see if that helps.  If no improvement in the next 1-2 weeks, f/u with PCP or return to our office for reexamination, and please feel free to call or return at any time for any questions or concerns you may have and we will be happy to help you!     ? ? ? ? ?ED Prescriptions   ? ? Medication Sig Dispense Auth. Provider  ? methylPREDNISolone (MEDROL DOSEPAK) 4 MG TBPK tablet Take according to Dosepak instructions 21 tablet Shirlee Latch, PA-C  ? ?  ? ?PDMP not reviewed this encounter. ?  ?Shirlee Latch, PA-C ?06/04/21 0813 ? ?

## 2021-06-03 NOTE — Discharge Instructions (Addendum)
PAIN: You likely pulled some muscles in your arm.  Stressed avoiding painful activities . Reviewed RICE guidelines. Use medications as directed, including prednisone, Tylenol, topical muscle rubs, lidocaine patches.  Try heat and see if that helps.  If no improvement in the next 1-2 weeks, f/u with PCP or return to our office for reexamination, and please feel free to call or return at any time for any questions or concerns you may have and we will be happy to help you!     ?

## 2021-06-03 NOTE — ED Triage Notes (Signed)
Patient presents to Urgent Care with complaints of left arm pain x 1 week. 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 Treating pain with ibuprofen. Last dose yesterday.  ?

## 2023-03-24 ENCOUNTER — Encounter: Payer: Self-pay | Admitting: Advanced Practice Midwife

## 2023-03-24 ENCOUNTER — Ambulatory Visit: Payer: Self-pay | Admitting: Advanced Practice Midwife

## 2023-03-24 VITALS — BP 115/75 | HR 89 | Ht 67.0 in | Wt 225.0 lb

## 2023-03-24 DIAGNOSIS — Z3046 Encounter for surveillance of implantable subdermal contraceptive: Secondary | ICD-10-CM

## 2023-03-24 MED ORDER — ETONOGESTREL 68 MG ~~LOC~~ IMPL
68.0000 mg | DRUG_IMPLANT | Freq: Once | SUBCUTANEOUS | Status: AC
  Administered 2023-03-24: 68 mg via SUBCUTANEOUS

## 2023-03-24 NOTE — Progress Notes (Signed)
GYNECOLOGY PROCEDURE NOTE  Patient is a 28 y.o. G0P0000 presenting for Nexplanon insertion as her desired means of contraception.  She provided informed consent, signed copy in the chart, time out was performed.  Self reported LMP of Patient's last menstrual period was 03/13/2023 (approximate). She has a current Nexplanon placed 3 years ago. Nexplanon removal discussed in detail.  Risks of infection, bleeding, nerve injury all reviewed.  Patient understands risks and desires to proceed.  Verbal consent obtained.  Patient is certain she wants the Nexplanon removed and replaced.  All questions answered.  She understands that Nexplanon is a progesterone only therapy, and that patients often have irregular and unpredictable vaginal bleeding or amenorrhea. She understands that other side effects are possible related to systemic progesterone, including but not limited to, headaches, breast tenderness, nausea, and irritability. While effective at preventing pregnancy long acting reversible contraceptives do not prevent transmission of sexually transmitted diseases and use of barrier methods for this purpose was discussed. The placement procedure for Nexplanon was reviewed with the patient in detail including risks of nerve injury, infection, bleeding and injury to other muscles or tendons. She understands that the Nexplanon implant is good for 3 years and needs to be removed at the end of that time.  She understands that Nexplanon is an extremely effective option for contraception, with failure rate of <1%. This information is reviewed today and all questions were answered. Informed consent was obtained, both verbally and written.   The patient is healthy and has no contraindications to Nexplanon use.  Review of Systems  Constitutional:  Negative for chills and fever.  HENT:  Negative for congestion, ear discharge, ear pain, hearing loss, sinus pain and sore throat.   Eyes:  Negative for blurred vision and  double vision.  Respiratory:  Negative for cough, shortness of breath and wheezing.   Cardiovascular:  Negative for chest pain, palpitations and leg swelling.  Gastrointestinal:  Negative for abdominal pain, blood in stool, constipation, diarrhea, heartburn, melena, nausea and vomiting.  Genitourinary:  Negative for dysuria, flank pain, frequency, hematuria and urgency.  Musculoskeletal:  Negative for back pain, joint pain and myalgias.  Skin:  Negative for itching and rash.  Neurological:  Negative for dizziness, tingling, tremors, sensory change, speech change, focal weakness, seizures, loss of consciousness, weakness and headaches.  Endo/Heme/Allergies:  Negative for environmental allergies. Does not bruise/bleed easily.  Psychiatric/Behavioral:  Negative for depression, hallucinations, memory loss, substance abuse and suicidal ideas. The patient is not nervous/anxious and does not have insomnia.    Vital Signs: BP 115/75   Pulse 89   Ht 5\' 7"  (1.702 m)   Wt 225 lb (102.1 kg)   LMP 03/13/2023 (Approximate)   BMI 35.24 kg/m  Constitutional: Well nourished, well developed female in no acute distress.  Skin: Warm and dry.  Cardiovascular: Regular rate and rhythm.   Extremity: no edema  Respiratory:  Normal respiratory effort Psych: Alert and Oriented x3. No memory deficits. Normal mood and affect.   Procedure: Appropriate time out taken Patient placed in dorsal supine with left arm above head, elbow flexed at 90 degrees, arm resting on examination table.  Nexplanon identified without problems.  Betadine scrub x3.  3 ml of 1% lidocaine injected under Nexplanon device without problems.  Sterile gloves applied.  Small 0.5cm incision made at distal tip of Nexplanon device with 11 blade scalpel.  Nexplanon brought to incision and grasped with a small kelly clamp.  Nexplanon removed intact without problems.  Nexplanon removed form sterile blister packaging,  Device confirmed in needle,  before inserting full length of needle, tenting up the skin as the needle was advanced.  The drug eluding rod was then deployed by pulling back the slider per the manufactures recommendation.  The implant was palpable by the clinician as well as the patient.  The insertion site was dressed with a steri strip and band aid before applying  a kerlex bandage pressure dressing. Minimal blood loss was noted during the procedure.  The patient tolerated the procedure well.   She was instructed to wear the bandage for 24 hours, call with any signs of infection.  She was given the Nexplanon card and instructed to have the rod removed in 3 years.  PAP: done in 2021 LGSIL with recommendation for repeat in 1 year. She did not follow up. Recommend she return for annual exam with PAP smear in the next month   Tresea Mall, CNM [facility] 03/24/2023 4:28 PM   Charge [id number] for nexplanon device, CPT 11981 for procedure J2001 for lidocaine administration Modifer 25, plus Modifer 79 is done during a global billing visit

## 2023-03-24 NOTE — Patient Instructions (Signed)
Human Papillomavirus (HPV) Vaccine Injection What is this medication? HUMAN PAPILLOMAVIRUS VACCINE (HYOO muhn pap uh LOH muh vahy ruhs vak SEEN) reduces the risk of human papillomavirus (HPV). It does not treat HPV. It is still possible to get HPV after receiving this vaccine, but the symptoms may be less severe or not last as long. It works by helping your immune system learn how to fight off a future infection. This medicine may be used for other purposes; ask your health care provider or pharmacist if you have questions. COMMON BRAND NAME(S): Gardasil 9 What should I tell my care team before I take this medication? They need to know if you have any of these conditions: Fever Hemophilia HIV or AIDS Immune system problems Infection Low platelets An unusual reaction to human papillomavirus vaccine, yeast, other vaccines, other medications, foods, dyes, or preservatives Pregnant or trying to get pregnant Breastfeeding How should I use this medication? This vaccine is injected into a muscle. It is given by your care team. This vaccine requires 2 or 3 doses to get the full benefit. Set a reminder for when your next dose is due. A copy of the Vaccine Information Statement will be given before each vaccination. Be sure to read this information carefully each time. This sheet may change often. Talk to your care team about the use of this medication in children. While it may be prescribed for children as young as 9 years for selected conditions, precautions do apply. Overdosage: If you think you have taken too much of this medicine contact a poison control center or emergency room at once. NOTE: This medicine is only for you. Do not share this medicine with others. What if I miss a dose? Keep appointments for follow-up doses as directed. It is important not to miss your dose. Call your care team if you are unable to keep an appointment. What may interact with this medication? Certain medications  for arthritis Medications for organ transplant Medications to treat cancer Steroid medications, such as prednisone or cortisone This list may not describe all possible interactions. Give your health care provider a list of all the medicines, herbs, non-prescription drugs, or dietary supplements you use. Also tell them if you smoke, drink alcohol, or use illegal drugs. Some items may interact with your medicine. What should I watch for while using this medication? Visit your care team regularly. Report any side effects to your care team right away. This vaccine, like all vaccines, may not fully protect everyone. What side effects may I notice from receiving this medication? Side effects that you should report to your care team as soon as possible: Allergic reactions--skin rash, itching, hives, swelling of the face, lips, tongue, or throat Feeling faint or lightheaded Side effects that usually do not require medical attention (report these to your care team if they continue or are bothersome): Diarrhea Dizziness Fatigue Fever Headache Nausea Pain, redness, irritation, or bruising at the injection site This list may not describe all possible side effects. Call your doctor for medical advice about side effects. You may report side effects to FDA at 1-800-FDA-1088. Where should I keep my medication? This vaccine is only given by your care team. It will not be stored at home. NOTE: This sheet is a summary. It may not cover all possible information. If you have questions about this medicine, talk to your doctor, pharmacist, or health care provider.  2024 Elsevier/Gold Standard (2021-07-29 00:00:00)Pap Test Why am I having this test? A Pap test, also  called a Pap smear, is a screening test to check for signs of: Infection. Cancer of the cervix. The cervix is the lower part of the uterus that opens into the vagina. Changes that may be a sign that cancer is developing (precancerous  changes). Women need this test on a regular basis. In general, you should have a Pap test every 3 years until you reach menopause or age 7. Women aged 30-60 may choose to have their Pap test done at the same time as an HPV (human papillomavirus) test every 5 years (instead of every 3 years). Your health care provider may recommend having Pap tests more or less often depending on your medical conditions and past Pap test results. What is being tested? Cervical cells are tested for signs of infection or abnormalities. What kind of sample is taken?  Your health care provider will collect a sample of cells from the surface of your cervix. This will be done using a small cotton swab, plastic spatula, or brush that is inserted into your vagina using a tool called a speculum. This sample is often collected during a pelvic exam, when you are lying on your back on an exam table with your feet in footrests (stirrups). In some cases, fluids (secretions) from the cervix or vagina may also be collected. How do I prepare for this test? Be aware of where you are in your menstrual cycle. If you are menstruating on the day of the test, you may be asked to reschedule. You may need to reschedule if you have a known vaginal infection on the day of the test. Follow instructions from your health care provider about: Changing or stopping your regular medicines. Some medicines can cause abnormal test results, such as vaginal medicines and tetracycline. Avoiding douching 2-3 days before or the day of the test. Tell a health care provider about: Any allergies you have. All medicines you are taking, including vitamins, herbs, eye drops, creams, and over-the-counter medicines. Any bleeding problems you have. Any surgeries you have had. Any medical conditions you have. Whether you are pregnant or may be pregnant. How are the results reported? Your test results will be reported as either abnormal or normal. What do the  results mean? A normal test result means that you do not have signs of cancer of the cervix. An abnormal result may mean that you have: Cancer. A Pap test by itself is not enough to diagnose cancer. You will have more tests done if cancer is suspected. Precancerous changes in your cervix. Inflammation of the cervix. An STI (sexually transmitted infection). A fungal infection. A parasite infection. Talk with your health care provider about what your results mean. In some cases, your health care provider may do more testing to confirm the results. Questions to ask your health care provider Ask your health care provider, or the department that is doing the test: When will my results be ready? How will I get my results? What are my treatment options? What other tests do I need? What are my next steps? Summary In general, women should have a Pap test every 3 years until they reach menopause or age 55. Your health care provider will collect a sample of cells from the surface of your cervix. This will be done using a small cotton swab, plastic spatula, or brush. In some cases, fluids (secretions) from the cervix or vagina may also be collected. This information is not intended to replace advice given to you by your health care  provider. Make sure you discuss any questions you have with your health care provider. Document Revised: 05/16/2020 Document Reviewed: 05/16/2020 Elsevier Patient Education  2024 [employer].

## 2023-04-04 ENCOUNTER — Other Ambulatory Visit (HOSPITAL_COMMUNITY)
Admission: RE | Admit: 2023-04-04 | Discharge: 2023-04-04 | Disposition: A | Discharge status: Home / Self Care | Payer: BC Managed Care – PPO | Source: Ambulatory Visit | Attending: Advanced Practice Midwife | Admitting: Advanced Practice Midwife

## 2023-04-04 ENCOUNTER — Encounter: Payer: Self-pay | Admitting: Advanced Practice Midwife

## 2023-04-04 ENCOUNTER — Ambulatory Visit (INDEPENDENT_AMBULATORY_CARE_PROVIDER_SITE_OTHER): Payer: BC Managed Care – PPO | Admitting: Advanced Practice Midwife

## 2023-04-04 VITALS — BP 121/74 | HR 97 | Ht 67.0 in | Wt 223.2 lb

## 2023-04-04 DIAGNOSIS — Z01419 Encounter for gynecological examination (general) (routine) without abnormal findings: Secondary | ICD-10-CM | POA: Diagnosis not present

## 2023-04-04 DIAGNOSIS — Z124 Encounter for screening for malignant neoplasm of cervix: Secondary | ICD-10-CM | POA: Insufficient documentation

## 2023-04-04 NOTE — Progress Notes (Signed)
[facility]   Gynecology Annual Exam  PCP: [facility]  Chief Complaint:  Chief Complaint  Patient presents with   Annual Exam    History of Present Illness: Patient is a 28 y.o. G0P0000 presents for annual exam. The patient has no complaints today. Last PAP was LGSIL in 2021. We discussed screening today and follow up as needed depending on result.  LMP: Patient's last menstrual period was 03/13/2023 (approximate) just prior to recent removal and replacement of Nexplanon Menarche:15 Generally no bleeding on Nexplanon Postcoital Bleeding: no Dysmenorrhea: no  The patient is sexually active. She currently uses Nexplanon for contraception. She denies dyspareunia.  The patient  does occasionally  perform self breast exams.  There is no notable family history of breast or ovarian cancer in her family.  The patient wears seatbelts: yes.  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    The patient denies current symptoms of depression.    Review of Systems: Review of Systems  Constitutional:  Negative for chills and fever.  HENT:  Negative for congestion, ear discharge, ear pain, hearing loss, sinus pain and sore throat.   Eyes:  Negative for blurred vision and double vision.  Respiratory:  Negative for cough, shortness of breath and wheezing.   Cardiovascular:  Negative for chest pain, palpitations and leg swelling.  Gastrointestinal:  Negative for abdominal pain, blood in stool, constipation, diarrhea, heartburn, melena, nausea and vomiting.  Genitourinary:  Negative for dysuria, flank pain, frequency, hematuria and urgency.  Musculoskeletal:  Negative for back pain, joint pain and myalgias.  Skin:  Negative for itching and rash.  Neurological:  Negative for dizziness, tingling, tremors, sensory change, speech change, focal weakness, seizures, loss of consciousness, weakness and headaches.  Endo/Heme/Allergies:  Negative for  environmental allergies. Does not bruise/bleed easily.  Psychiatric/Behavioral:  Negative for depression, hallucinations, memory loss, substance abuse and suicidal ideas. The patient is not nervous/anxious and does not have insomnia.     Past Medical History:  There are no active problems to display for this patient.   Past Surgical History:  Past Surgical History:  Procedure Laterality Date   NO PAST SURGERIES      Gynecologic History:  Patient's last menstrual period was 03/13/2023 (approximate). Contraception: Nexplanon Last Pap: 2021 Results were:   LGSIL    Obstetric History: G0P0000  Family History:  Family History  Problem Relation Age of Onset   Congestive Heart Failure Mother    Hypertension Father    Breast cancer Maternal Aunt        late years   Diabetes Mellitus II Paternal Grandmother     Social History:  Social History   Socioeconomic History   Marital status: Single    Spouse name: Not on file   Number of children: Not on file   Years of education: Not on file   Highest education level: Not on file  Occupational History   Not on file  Tobacco Use   Smoking status: Former    Current packs/day: 0.00    Types: Cigarettes    Quit date: 03/28/2017    Years since quitting: 6.0   Smokeless tobacco: Never  Vaping Use   Vaping status: Former   Quit date: 03/28/2017   Substances: Nicotine, Flavoring  Substance and Sexual Activity   Alcohol use: Yes    Comment: occ   Drug use: No   Sexual activity: Yes    Birth control/protection:  Implant  Other Topics Concern   Not on file  Social History Narrative   Not on file   Social Drivers of Health   Financial Resource Strain: Not on file  Food Insecurity: Not on file  Transportation Needs: Not on file  Physical Activity: Not on file  Stress: Not on file  Social Connections: Not on file  Intimate Partner Violence: Not on file    Allergies:  Allergies  Allergen Reactions   Pollen Extract Other  (See Comments)    Itchy, water eyes, stuffy nose, congestion Itchy, water eyes, stuffy nose, congestion    Medications: Prior to Admission medications   Medication Sig Start Date End Date Taking? Authorizing Provider  etonogestrel (NEXPLANON) 68 MG IMPL implant 1 each by Subdermal route once. 03/24/23 03/23/26 Yes Tresea Mall, CNM    Physical Exam Vitals: Blood pressure 121/74, pulse 97, height 5\' 7"  (1.702 m), weight 223 lb 3.2 oz (101.2 kg), last menstrual period 03/13/2023.  General: NAD HEENT: normocephalic, anicteric Thyroid: no enlargement, no palpable nodules Pulmonary: No increased work of breathing, CTAB Cardiovascular: RRR, distal pulses 2+ Breast: Breast symmetrical, no tenderness, no palpable nodules or masses, no skin or nipple retraction present, no nipple discharge.  No axillary or supraclavicular lymphadenopathy. Abdomen: NABS, soft, non-tender, non-distended.  Umbilicus without lesions.  No hepatomegaly, splenomegaly or masses palpable. No evidence of hernia  Genitourinary:  External: Normal external female genitalia.  Normal urethral meatus, normal Bartholin's and Skene's glands.    Vagina: Normal vaginal mucosa, no evidence of prolapse.    Cervix: Grossly normal in appearance, no bleeding  Uterus: Non-enlarged, mobile, normal contour.  No CMT  Adnexa: ovaries non-enlarged, no adnexal masses  Rectal: deferred  Lymphatic: no evidence of inguinal lymphadenopathy Extremities: no edema, erythema, or tenderness Neurologic: Grossly intact Psychiatric: mood appropriate, affect full   Assessment: 28 y.o. G0P0000 routine annual exam  Plan: Problem List Items Addressed This Visit   None Visit Diagnoses       Well woman exam with routine gynecological exam    -  Primary   Relevant Orders   Cytology - PAP     Screening for cervical cancer       Relevant Orders   Cytology - PAP       1) 4) Gardasil Series discussed and if applicable offered to patient -  Patient has previously completed 3 shot series per her report  2) STI screening  was offered and declined  3)  ASCCP guidelines and rationale discussed.  Patient opts for every 3 years screening interval  4) Contraception - the patient is currently using  Nexplanon.  She is happy with her current form of contraception and plans to continue We discussed safe sex practices to reduce her furture risk of STI's.    5) Return in about 1 year (around 04/03/2024) for annual established gyn.   Tresea Mall, CNM [facility] 04/04/2023 2:28 PM

## 2023-04-04 NOTE — Patient Instructions (Signed)
Pap Test Why am I having this test? A Pap test, also called a Pap smear, is a screening test to check for signs of: Infection. Cancer of the cervix. The cervix is the lower part of the uterus that opens into the vagina. Changes that may be a sign that cancer is developing (precancerous changes). Women need this test on a regular basis. In general, you should have a Pap test every 3 years until you reach menopause or age 26. Women aged 30-60 may choose to have their Pap test done at the same time as an HPV (human papillomavirus) test every 5 years (instead of every 3 years). Your health care provider may recommend having Pap tests more or less often depending on your medical conditions and past Pap test results. What is being tested? Cervical cells are tested for signs of infection or abnormalities. What kind of sample is taken?  Your health care provider will collect a sample of cells from the surface of your cervix. This will be done using a small cotton swab, plastic spatula, or brush that is inserted into your vagina using a tool called a speculum. This sample is often collected during a pelvic exam, when you are lying on your back on an exam table with your feet in footrests (stirrups). In some cases, fluids (secretions) from the cervix or vagina may also be collected. How do I prepare for this test? Be aware of where you are in your menstrual cycle. If you are menstruating on the day of the test, you may be asked to reschedule. You may need to reschedule if you have a known vaginal infection on the day of the test. Follow instructions from your health care provider about: Changing or stopping your regular medicines. Some medicines can cause abnormal test results, such as vaginal medicines and tetracycline. Avoiding douching 2-3 days before or the day of the test. Tell a health care provider about: Any allergies you have. All medicines you are taking, including vitamins, herbs, eye drops,  creams, and over-the-counter medicines. Any bleeding problems you have. Any surgeries you have had. Any medical conditions you have. Whether you are pregnant or may be pregnant. How are the results reported? Your test results will be reported as either abnormal or normal. What do the results mean? A normal test result means that you do not have signs of cancer of the cervix. An abnormal result may mean that you have: Cancer. A Pap test by itself is not enough to diagnose cancer. You will have more tests done if cancer is suspected. Precancerous changes in your cervix. Inflammation of the cervix. An STI (sexually transmitted infection). A fungal infection. A parasite infection. Talk with your health care provider about what your results mean. In some cases, your health care provider may do more testing to confirm the results. Questions to ask your health care provider Ask your health care provider, or the department that is doing the test: When will my results be ready? How will I get my results? What are my treatment options? What other tests do I need? What are my next steps? Summary In general, women should have a Pap test every 3 years until they reach menopause or age 26. Your health care provider will collect a sample of cells from the surface of your cervix. This will be done using a small cotton swab, plastic spatula, or brush. In some cases, fluids (secretions) from the cervix or vagina may also be collected. This information is not   intended to replace advice given to you by your health care provider. Make sure you discuss any questions you have with your health care provider. Document Revised: 05/16/2020 Document Reviewed: 05/16/2020 Elsevier Patient Education  2024 Elsevier Inc.  

## 2023-04-07 ENCOUNTER — Encounter: Payer: Self-pay | Admitting: Advanced Practice Midwife

## 2023-04-07 LAB — CYTOLOGY - PAP
Comment: NEGATIVE
Diagnosis: NEGATIVE
Diagnosis: REACTIVE
High risk HPV: NEGATIVE

## 2023-07-05 ENCOUNTER — Ambulatory Visit (INDEPENDENT_AMBULATORY_CARE_PROVIDER_SITE_OTHER)

## 2023-07-05 ENCOUNTER — Ambulatory Visit
Admission: EM | Admit: 2023-07-05 | Discharge: 2023-07-05 | Disposition: A | Discharge status: Home / Self Care | Attending: Physician Assistant | Admitting: Physician Assistant

## 2023-07-05 DIAGNOSIS — M25562 Pain in left knee: Secondary | ICD-10-CM | POA: Diagnosis not present

## 2023-07-05 MED ORDER — PREDNISONE 10 MG PO TABS: ORAL_TABLET | ORAL | 0 refills | Status: AC

## 2023-07-05 NOTE — Discharge Instructions (Signed)
-   Nothing abnormal on the x-ray. -Possible knee sprain versus injury from meniscus. - We provided with a knee brace.  Wear that for stability. - Ice and elevate knee. - May take Tylenol for pain only. - Start the corticosteroid taper. - If no relief or worsening symptoms over the next week please follow-up with orthopedics as you may need further imaging such as an MRI.  May also contact PT.  You have a condition requiring you to follow up with Orthopedics so please call one of the following office for appointment:   [facility] Address: [address], [place], [place] [address] Phone: [phone]  [facility] [address], [place], [place] [address] Phone: [phone]  [facility] [address] [place], [place] [address] Phone: [phone]   [facility] [address], [place] [address] Phone: [phone]

## 2023-07-05 NOTE — ED Triage Notes (Signed)
 Pt c/o L knee pain x1 wk. States woke up with pain. Denies any falls or injuries.

## 2023-07-05 NOTE — ED Provider Notes (Signed)
 [facility]    CSN: [id number] Arrival date & time: 07/05/23  1546      History   Chief Complaint Chief Complaint  Patient presents with   Knee Pain    HPI Krista Logan is a 28 y.o. female presenting for 1 week history of left knee pain and swelling. 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 Has increased pain with squatting and changing positions from sitting to standing, flexing knee and with initial weightbearing. Reports the knee feels a little unstable at times. Has taken 400 mg ibuprofen  a couple of times per day with no improvement. No history of knee problems.   HPI  Past Medical History:  Diagnosis Date   Seasonal allergies     There are no active problems to display for this patient.   Past Surgical History:  Procedure Laterality Date   NO PAST SURGERIES      OB History     Gravida  0   Para  0   Term  0   Preterm  0   AB  0   Living  0      SAB  0   IAB  0   Ectopic  0   Multiple  0   Live Births  0            Home Medications    Prior to Admission medications   Medication Sig Start Date End Date Taking? Authorizing Provider  etonogestrel  (NEXPLANON ) 68 MG IMPL implant 1 each by Subdermal route once. 03/24/23 03/23/26 Yes Angelita Kendall, CNM  predniSONE (DELTASONE) 10 MG tablet Take 6 tabs po on day 1 and decrease by 1 tab daily until complete 07/05/23  Yes Floydene Hy, PA-C    Family History Family History  Problem Relation Age of Onset   Congestive Heart Failure Mother    Hypertension Father    Breast cancer Maternal Aunt        late years   Diabetes Mellitus II Paternal Grandmother     Social History Social History   Tobacco Use   Smoking status: Former    Current packs/day: 0.00    Types: Cigarettes    Quit date: 03/28/2017    Years since quitting: 6.2   Smokeless  tobacco: Never  Vaping Use   Vaping status: Former   Quit date: 03/28/2017   Substances: Nicotine, Flavoring  Substance Use Topics   Alcohol use: Yes    Comment: occ   Drug use: No     Allergies   Pollen extract   Review of Systems Review of Systems  Musculoskeletal:  Positive for arthralgias and joint swelling. Negative for gait problem.  Skin:  Negative for color change, rash and wound.  Neurological:  Negative for weakness and numbness.     Physical Exam Triage Vital Signs ED Triage Vitals  Encounter Vitals Group     BP 07/05/23 1614 117/81     Systolic BP Percentile --      Diastolic BP Percentile --      Pulse Rate 07/05/23 1614 80     Resp 07/05/23 1614 16     Temp 07/05/23 1614 99.2 F (37.3 C)     Temp Source 07/05/23 1614 Oral     SpO2 07/05/23 1614 98 %     Weight  07/05/23 1614 200 lb (90.7 kg)     Height 07/05/23 1614 5\' 7"  (1.702 m)     Head Circumference --      Peak Flow --      Pain Score 07/05/23 1617 10     Pain Loc --      Pain Education --      Exclude from Growth Chart --    No data found.  Updated Vital Signs BP 117/81 (BP Location: Right Arm)   Pulse 80   Temp 99.2 F (37.3 C) (Oral)   Resp 16   Ht 5\' 7"  (1.702 m)   Wt 200 lb (90.7 kg)   SpO2 98%   BMI 31.32 kg/m      Physical Exam Vitals and nursing note reviewed.  Constitutional:      General: She is not in acute distress.    Appearance: Normal appearance. She is not ill-appearing or toxic-appearing.  HENT:     Head: Normocephalic and atraumatic.  Eyes:     General: No scleral icterus.       Right eye: No discharge.        Left eye: No discharge.     Conjunctiva/sclera: Conjunctivae normal.  Cardiovascular:     Rate and Rhythm: Normal rate and regular rhythm.  Pulmonary:     Effort: Pulmonary effort is normal. No respiratory distress.  Musculoskeletal:     Cervical back: Neck supple.     Left knee: Swelling present. No deformity. Normal range of motion (painful full  ROM (flexion)). Tenderness present over the lateral joint line and LCL. Normal pulse.  Skin:    General: Skin is dry.  Neurological:     General: No focal deficit present.     Mental Status: She is alert. Mental status is at baseline.     Motor: No weakness.     Gait: Gait normal.  Psychiatric:        Mood and Affect: Mood normal.        Behavior: Behavior normal.      UC Treatments / Results  Labs (all labs ordered are listed, but only abnormal results are displayed) Labs Reviewed - No data to display  EKG   Radiology DG Knee Complete 4 Views Left Result Date: 07/05/2023 CLINICAL DATA:  Pain, swelling.  No known injury. EXAM: LEFT KNEE - COMPLETE 4+ VIEW COMPARISON:  None Available. FINDINGS: No evidence of fracture, dislocation, or joint effusion. No evidence of arthropathy or other focal bone abnormality. Soft tissues are unremarkable. IMPRESSION: Negative. Electronically Signed   By: Janeece Mechanic M.D.   On: 07/05/2023 17:11    Procedures Procedures (including critical care time)  Medications Ordered in UC Medications - No data to display  Initial Impression / Assessment and Plan / UC Course  I have reviewed the triage vital signs and the nursing notes.  Pertinent labs & imaging results that were available during my care of the patient were reviewed by me and considered in my medical decision making (see chart for details).   28 y/o female presents for 1 week history of left knee pain and swelling. Denies injury.  Patient reports that she drives a forklift and has to sit for long periods of time.  Has been at this job for the past 5 years.  Also reports she often stops with her left leg first and sometimes comes down hard on the leg when getting up the forklift.  X-ray of left knee obtained. Negative.  Knee sprain/strain versus meniscus tear.  Patient was given supportive knee brace.  Sent prednisone taper to pharmacy reviewed RICE guidelines.  Information given for  orthopedics and PT if not improving over the next week or if symptoms worsen.   Final Clinical Impressions(s) / UC Diagnoses   Final diagnoses:  Acute pain of left knee  Left lateral knee pain     Discharge Instructions      - Nothing abnormal on the x-ray. -Possible knee sprain versus injury from meniscus. - We provided with a knee brace.  Wear that for stability. - Ice and elevate knee. - May take Tylenol for pain only. - Start the corticosteroid taper. - If no relief or worsening symptoms over the next week please follow-up with orthopedics as you may need further imaging such as an MRI.  May also contact PT.  You have a condition requiring you to follow up with Orthopedics so please call one of the following office for appointment:   [facility] Address: [address], [place], [place] [address] Phone: [phone]  [facility] [address] [place], [place] [address] Phone: [phone]  [facility] [address] [place], [place] [address] Phone: [phone]   [facility] [address], [place] [address] Phone: [phone]     ED Prescriptions     Medication Sig Dispense Auth. Provider   predniSONE (DELTASONE) 10 MG tablet Take 6 tabs po on day 1 and decrease by 1 tab daily until complete 21 tablet Floydene Hy, PA-C      PDMP not reviewed this encounter.   Nancy Axon B, PA-C 07/05/23 1747
